# Patient Record
Sex: Female | Born: 1975 | Race: Black or African American | Hispanic: No | Marital: Single | Smoking: Current some day smoker
Health system: Southern US, Community
[De-identification: ages and names within clinical notes are randomized; demographics above are authoritative.]

## PROBLEM LIST (undated history)

## (undated) DIAGNOSIS — D219 Benign neoplasm of connective and other soft tissue, unspecified: Secondary | ICD-10-CM

## (undated) DIAGNOSIS — D649 Anemia, unspecified: Secondary | ICD-10-CM

## (undated) HISTORY — DX: Benign neoplasm of connective and other soft tissue, unspecified: D21.9

## (undated) HISTORY — DX: Anemia, unspecified: D64.9

---

## 2000-04-03 ENCOUNTER — Emergency Department (HOSPITAL_COMMUNITY): Admission: EM | Admit: 2000-04-03 | Discharge: 2000-04-03 | Payer: Self-pay | Admitting: Emergency Medicine

## 2000-04-03 ENCOUNTER — Encounter: Payer: Self-pay | Admitting: Emergency Medicine

## 2000-04-15 ENCOUNTER — Ambulatory Visit (HOSPITAL_COMMUNITY): Admission: RE | Admit: 2000-04-15 | Discharge: 2000-04-15 | Payer: Self-pay | Admitting: General Surgery

## 2000-04-16 ENCOUNTER — Encounter: Payer: Self-pay | Admitting: General Surgery

## 2000-04-20 ENCOUNTER — Encounter: Payer: Self-pay | Admitting: General Surgery

## 2000-04-20 ENCOUNTER — Encounter (INDEPENDENT_AMBULATORY_CARE_PROVIDER_SITE_OTHER): Payer: Self-pay | Admitting: *Deleted

## 2000-04-20 ENCOUNTER — Ambulatory Visit (HOSPITAL_COMMUNITY): Admission: RE | Admit: 2000-04-20 | Discharge: 2000-04-20 | Payer: Self-pay | Admitting: General Surgery

## 2001-01-24 ENCOUNTER — Emergency Department (HOSPITAL_COMMUNITY): Admission: EM | Admit: 2001-01-24 | Discharge: 2001-01-25 | Payer: Self-pay | Admitting: Emergency Medicine

## 2002-02-06 ENCOUNTER — Encounter: Payer: Self-pay | Admitting: Emergency Medicine

## 2002-02-06 ENCOUNTER — Emergency Department (HOSPITAL_COMMUNITY): Admission: EM | Admit: 2002-02-06 | Discharge: 2002-02-06 | Payer: Self-pay | Admitting: Emergency Medicine

## 2002-05-07 ENCOUNTER — Emergency Department (HOSPITAL_COMMUNITY): Admission: EM | Admit: 2002-05-07 | Discharge: 2002-05-07 | Payer: Self-pay | Admitting: Emergency Medicine

## 2004-04-03 ENCOUNTER — Emergency Department (HOSPITAL_COMMUNITY): Admission: EM | Admit: 2004-04-03 | Discharge: 2004-04-03 | Payer: Self-pay | Admitting: Emergency Medicine

## 2004-04-07 ENCOUNTER — Emergency Department (HOSPITAL_COMMUNITY): Admission: EM | Admit: 2004-04-07 | Discharge: 2004-04-07 | Payer: Self-pay | Admitting: Emergency Medicine

## 2006-12-08 ENCOUNTER — Inpatient Hospital Stay (HOSPITAL_COMMUNITY): Admission: AD | Admit: 2006-12-08 | Discharge: 2006-12-08 | Payer: Self-pay | Admitting: Family Medicine

## 2010-05-28 ENCOUNTER — Inpatient Hospital Stay (HOSPITAL_COMMUNITY): Payer: Self-pay

## 2010-05-28 ENCOUNTER — Inpatient Hospital Stay (HOSPITAL_COMMUNITY)
Admission: AD | Admit: 2010-05-28 | Discharge: 2010-05-28 | Disposition: A | Discharge status: Home / Self Care | Payer: Self-pay | Source: Ambulatory Visit | Attending: Obstetrics & Gynecology | Admitting: Obstetrics & Gynecology

## 2010-05-28 DIAGNOSIS — N200 Calculus of kidney: Secondary | ICD-10-CM

## 2010-05-28 DIAGNOSIS — R109 Unspecified abdominal pain: Secondary | ICD-10-CM

## 2010-05-28 DIAGNOSIS — A5901 Trichomonal vulvovaginitis: Secondary | ICD-10-CM

## 2010-05-28 DIAGNOSIS — D259 Leiomyoma of uterus, unspecified: Secondary | ICD-10-CM

## 2010-05-28 LAB — URINALYSIS, ROUTINE W REFLEX MICROSCOPIC
Bilirubin Urine: NEGATIVE
Ketones, ur: NEGATIVE mg/dL
Leukocytes, UA: NEGATIVE
Nitrite: NEGATIVE
Protein, ur: NEGATIVE mg/dL
Specific Gravity, Urine: 1.015 (ref 1.005–1.030)
Urine Glucose, Fasting: NEGATIVE mg/dL
Urobilinogen, UA: 1 mg/dL (ref 0.0–1.0)
pH: 7 (ref 5.0–8.0)

## 2010-05-28 LAB — DIFFERENTIAL
Basophils Relative: 0 % (ref 0–1)
Eosinophils Absolute: 0.1 10*3/uL (ref 0.0–0.7)
Eosinophils Relative: 2 % (ref 0–5)
Monocytes Relative: 7 % (ref 3–12)
Neutrophils Relative %: 58 % (ref 43–77)

## 2010-05-28 LAB — CBC
MCH: 29.7 pg (ref 26.0–34.0)
MCV: 88.8 fL (ref 78.0–100.0)
Platelets: 269 10*3/uL (ref 150–400)
RBC: 4.38 MIL/uL (ref 3.87–5.11)
RDW: 13.5 % (ref 11.5–15.5)

## 2010-05-28 LAB — WET PREP, GENITAL: Yeast Wet Prep HPF POC: NONE SEEN

## 2010-05-28 LAB — URINE MICROSCOPIC-ADD ON

## 2010-05-28 LAB — POCT PREGNANCY, URINE: Preg Test, Ur: NEGATIVE

## 2010-05-28 MED ORDER — IOHEXOL 300 MG/ML  SOLN
100.0000 mL | Freq: Once | INTRAMUSCULAR | Status: AC | PRN
  Administered 2010-05-28: 100 mL via INTRAVENOUS

## 2010-05-29 LAB — GC/CHLAMYDIA PROBE AMP, GENITAL: GC Probe Amp, Genital: NEGATIVE

## 2010-06-06 ENCOUNTER — Encounter (INDEPENDENT_AMBULATORY_CARE_PROVIDER_SITE_OTHER): Payer: Self-pay | Admitting: Obstetrics and Gynecology

## 2010-06-06 ENCOUNTER — Encounter (INDEPENDENT_AMBULATORY_CARE_PROVIDER_SITE_OTHER): Payer: Self-pay | Admitting: *Deleted

## 2010-06-06 DIAGNOSIS — N946 Dysmenorrhea, unspecified: Secondary | ICD-10-CM

## 2010-06-06 DIAGNOSIS — D259 Leiomyoma of uterus, unspecified: Secondary | ICD-10-CM

## 2010-06-06 LAB — CONVERTED CEMR LAB: Trich, Wet Prep: NONE SEEN

## 2010-06-28 NOTE — Progress Notes (Signed)
NAMEPRESLEY, GORA NO.:  [id number]  MEDICAL RECORD NO.:  [id number]           PATIENT TYPE:  A  LOCATION:  [facility]                   FACILITY:  [facility]  PHYSICIAN:  Catalina Antigua, MD     DATE OF BIRTH:  1975-12-18  DATE OF SERVICE:  06/06/2010                                 CLINIC NOTE  This is a 35 year old nulligravida patient who presents today as an MAU followup for, the patient states as, test-of-cure for Trichomonas.  The patient was seen in MAU on May 28, 2010, for evaluation of abdominal pain.  The patient had a CAT scan on that day which demonstrated the presence of left nephrolithiasis without hydronephrosis and incidental finding of a 7.2-cm degenerating fibroid.  The patient states that prior to her MAU visit, she has never had any pelvic pain other than associated with her periods.  The patient states that following her MAU visit and the passage of the kidney stones, again she has not had any chronic pelvic pain but rather irregular dysmenorrhea. The patient describes her period as occurring every month, lasting 5 days, but states they are very heavy with occasional passage of clots. The patient denies any chest pain or lightheadedness or any hospitalization for blood transfusion.  As stated before, the patient presents today for test-of-cure for Trichomonas and was inquiring on further management of her fibroids.  PAST MEDICAL HISTORY:  She denies.  PAST SURGICAL HISTORY:  She denies.  PAST OB HISTORY:  She is nulligravid.  PAST GYN HISTORY:  She does have fibroids.  She denies any history of ovarian cyst.  She does have a history of Trichomonas which was recently treated and states that her partner has also been treated.  The onset of her menses occurred at the age of 28 and they have always been regular. She is not currently using any form of birth control at this time.  REVIEW OF SYSTEMS:  Otherwise within normal  limits.  FAMILY HISTORY:  Significant for sister with type of cancer which she is unclear of the etiology but states that it is not ovarian or breast cancer.  SOCIAL HISTORY:  The patient is a current smoker but states that she is trying to quit at this time.  She denies the abuse of alcohol or illicit drug use.  The patient is in a relationship and would like to conceive in the future.  PHYSICAL EXAMINATION:  ABDOMEN:  Soft, nontender, nondistended. PELVIC:  She had normal vaginal mucosa and normal-appearing cervix.  No abnormal bleeding or discharge.  She had approximately 10-week size uterus.  No palpable adnexal masses or tenderness.  ASSESSMENT AND PLAN:  This is a 35 year old nulligravid patient with history of dysmenorrhea and fibroid uterus who presents today following treatment of Trichomonas.  The patient desiring test of cure.  Repeat wet prep was performed today.  The patient was counseled regarding management of her dysmenorrhea with birth control and is interested in Depo-Provera at this time.  The patient will return in 3 months for continued Depo-Provera or further management.  The patient was counseled that if medical management of her dysmenorrhea is not  sufficient that perhaps a myomectomy may be necessary.  The patient verbalized understanding and all questions were answered.  The patient will be contacted with any abnormal results and will return in 3 months.          ______________________________ Catalina Antigua, MD    PC/MEDQ  D:  06/06/2010  T:  06/07/2010  Job:  [id number]

## 2010-07-18 ENCOUNTER — Inpatient Hospital Stay (HOSPITAL_COMMUNITY): Payer: Self-pay

## 2010-07-18 ENCOUNTER — Inpatient Hospital Stay (HOSPITAL_COMMUNITY)
Admission: AD | Admit: 2010-07-18 | Discharge: 2010-07-18 | Disposition: A | Discharge status: Home / Self Care | Payer: Self-pay | Source: Ambulatory Visit | Attending: Obstetrics & Gynecology | Admitting: Obstetrics & Gynecology

## 2010-07-18 DIAGNOSIS — R109 Unspecified abdominal pain: Secondary | ICD-10-CM | POA: Insufficient documentation

## 2010-07-18 DIAGNOSIS — D259 Leiomyoma of uterus, unspecified: Secondary | ICD-10-CM

## 2010-07-18 LAB — WET PREP, GENITAL
Trich, Wet Prep: NONE SEEN
Yeast Wet Prep HPF POC: NONE SEEN

## 2010-07-18 LAB — URINALYSIS, ROUTINE W REFLEX MICROSCOPIC
Bilirubin Urine: NEGATIVE
Glucose, UA: NEGATIVE mg/dL
Specific Gravity, Urine: 1.025 (ref 1.005–1.030)
Urobilinogen, UA: 0.2 mg/dL (ref 0.0–1.0)
pH: 7 (ref 5.0–8.0)

## 2010-07-18 LAB — COMPREHENSIVE METABOLIC PANEL
ALT: 18 U/L (ref 0–35)
Albumin: 3.7 g/dL (ref 3.5–5.2)
Alkaline Phosphatase: 56 U/L (ref 39–117)
Calcium: 8.9 mg/dL (ref 8.4–10.5)
GFR calc Af Amer: >60 mL/min (ref >60)
Glucose, Bld: 87 mg/dL (ref 70–99)
Potassium: 3.7 mEq/L (ref 3.5–5.1)
Sodium: 136 mEq/L (ref 135–145)
Total Protein: 6.5 g/dL (ref 6.0–8.3)

## 2010-07-18 LAB — URINE MICROSCOPIC-ADD ON

## 2010-07-18 LAB — POCT PREGNANCY, URINE: Preg Test, Ur: NEGATIVE

## 2010-07-18 LAB — CBC
HCT: 39.6 % (ref 36.0–46.0)
Hemoglobin: 12.9 g/dL (ref 12.0–15.0)
MCV: 90.4 fL (ref 78.0–100.0)
RBC: 4.38 MIL/uL (ref 3.87–5.11)
RDW: 14 % (ref 11.5–15.5)
WBC: 8.7 10*3/uL (ref 4.0–10.5)

## 2010-07-19 ENCOUNTER — Ambulatory Visit: Payer: Self-pay | Admitting: Family

## 2010-07-19 LAB — GC/CHLAMYDIA PROBE AMP, GENITAL
Chlamydia, DNA Probe: NEGATIVE
GC Probe Amp, Genital: NEGATIVE

## 2010-07-19 LAB — URINE CULTURE

## 2010-08-01 ENCOUNTER — Ambulatory Visit (INDEPENDENT_AMBULATORY_CARE_PROVIDER_SITE_OTHER): Payer: Self-pay | Admitting: Obstetrics & Gynecology

## 2010-08-01 DIAGNOSIS — N949 Unspecified condition associated with female genital organs and menstrual cycle: Secondary | ICD-10-CM

## 2010-08-01 DIAGNOSIS — D259 Leiomyoma of uterus, unspecified: Secondary | ICD-10-CM

## 2010-08-01 DIAGNOSIS — Z01818 Encounter for other preprocedural examination: Secondary | ICD-10-CM

## 2010-08-02 NOTE — Group Therapy Note (Signed)
Paige Rowe, Paige Rowe NO.:  [id number]  MEDICAL RECORD NO.:  [id number]           PATIENT TYPE:  A  LOCATION:  [facility]                   [facility]ACILITY:  [facility]  PHYSICIAN:  Jaynie Collins, MD     DATE OF BIRTH:  01-23-1976  DATE OF SERVICE:  08/01/2010                                 CLINIC NOTE  REASON FOR VISIT:  Surgical consultation.  The patient is a 34 year old nulligravida patient, last menstrual period of June 28, 2010, who is here for surgical consult for symptomatic large uterine fibroid.  The patient did have an ultrasound that was done on July 18, 2010, which showed a 10-week size uterus, but with a 7.6 cm right posterolateral upper uterine segment fibroid.  The rest of the myometrium was homogeneous and endometrium in thin.  Right ovary was not visualized, left ovary was normal.  The patient is currently being managed on hydromorphone and ibuprofen.  She does not need a prescription for these today.  She also did get a Depo-Provera injection to help with her dysfunctional uterine bleeding and to hold her until a myomectomy is scheduled.  The patient does want to proceed with scheduling this myomectomy.  She does not have any pregnancies and does want to retain childbearing ability.  The patient was counseled regarding the risks of abdominal myomectomy, the routine risks of bleeding, infection, injury to surrounding organs, need for additional procedures were reviewed with the patient.  She has only had one other prior procedure, which was a benign mass removal from her right hip area.  It sounds like it could have been the lipoma.  She did not have any anesthetic or surgical complications.  No other comorbidities.  The patient was told about the routine hospital time and recovery time, and all her questions were answered.  She was told to expect to hear from our surgical scheduler regarding the time and date of her surgery.  In the meantime, she  should be on pain medications.  If she does run out of pain medications, she should call for a refill prescription.  The patient was told to call or come back in for her any further gynecologic concerns.          ______________________________ Jaynie Collins, MD    UA/MEDQ  D:  08/01/2010  T:  08/02/2010  Job:  [id number]

## 2010-08-06 HISTORY — PX: MYOMECTOMY: SHX85

## 2010-08-07 ENCOUNTER — Ambulatory Visit: Payer: Self-pay | Admitting: Obstetrics & Gynecology

## 2010-08-12 ENCOUNTER — Other Ambulatory Visit: Payer: Self-pay | Admitting: Obstetrics & Gynecology

## 2010-08-12 ENCOUNTER — Encounter (HOSPITAL_COMMUNITY): Payer: Self-pay | Attending: Obstetrics & Gynecology

## 2010-08-12 DIAGNOSIS — Z01818 Encounter for other preprocedural examination: Secondary | ICD-10-CM | POA: Insufficient documentation

## 2010-08-12 DIAGNOSIS — Z01812 Encounter for preprocedural laboratory examination: Secondary | ICD-10-CM | POA: Insufficient documentation

## 2010-08-12 LAB — CBC
HCT: 38.4 % (ref 36.0–46.0)
Hemoglobin: 12.9 g/dL (ref 12.0–15.0)
MCV: 89.3 fL (ref 78.0–100.0)
WBC: 7.5 10*3/uL (ref 4.0–10.5)

## 2010-08-12 LAB — SURGICAL PCR SCREEN: MRSA, PCR: NEGATIVE

## 2010-08-19 ENCOUNTER — Inpatient Hospital Stay (HOSPITAL_COMMUNITY)
Admission: AD | Admit: 2010-08-19 | Discharge: 2010-08-20 | DRG: 743 | Disposition: A | Discharge status: Home / Self Care | Payer: Self-pay | Source: Ambulatory Visit | Attending: Obstetrics & Gynecology | Admitting: Obstetrics & Gynecology

## 2010-08-19 ENCOUNTER — Other Ambulatory Visit: Payer: Self-pay | Admitting: Obstetrics & Gynecology

## 2010-08-19 DIAGNOSIS — N949 Unspecified condition associated with female genital organs and menstrual cycle: Secondary | ICD-10-CM

## 2010-08-19 DIAGNOSIS — D252 Subserosal leiomyoma of uterus: Principal | ICD-10-CM | POA: Diagnosis present

## 2010-08-19 LAB — TYPE AND SCREEN: Antibody Screen: NEGATIVE

## 2010-08-19 LAB — PREGNANCY, URINE: Preg Test, Ur: NEGATIVE

## 2010-08-20 LAB — CBC
MCHC: 32.7 g/dL (ref 30.0–36.0)
MCV: 89.4 fL (ref 78.0–100.0)
Platelets: 243 10*3/uL (ref 150–400)
RDW: 13.4 % (ref 11.5–15.5)
WBC: 11.6 10*3/uL — ABNORMAL HIGH (ref 4.0–10.5)

## 2010-08-23 NOTE — Op Note (Signed)
[facility]  Patient:    Paige Rowe, Paige Rowe                       MRN: [id number] Proc. Date: 04/20/00 Adm. Date:  [id number] Attending:  Henrene Dodge CC:         Anselm Pancoast. Zachery Dakins, M.D.   Operative Report  PREOPERATIVE DIAGNOSIS: Large mass of right hip, subcutaneous.  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  SURGEON: Anselm Pancoast. Zachery Dakins, M.D.  ASSISTANT: Currie Paris, M.D.  OPERATION/PROCEDURE: Excision of large complex subcutaneous mass of right hip area.  ANESTHESIA: General.  INDICATIONS FOR PROCEDURE: The patient is a 35 year old black female, originally from [place], [place], presented to the [facility] Emergency Room with pain and enlarging mass in the right hip area of several weeks duration.  The patient stated that she had had a mass that started small and was thought to be a fatty tumor.  She had seen a physician in [place], [place].  This has just kind of gradually enlarged.  It was not symptomatic until recently.  She started having low-grade and then significant pain and she presented to the ER.  She was evaluated there and a pelvic x-ray obtained was essentially unremarkable.  She was referred to our office and I saw her Monday one week ago, and she had this mass that was about the size of a baseball and about the consistency of a baseball and quite tender.  I was concerned that it was probably a malignancy. CT without contrast was performed and this showed changes of a dense mass that did not look like a lipoma.  It was not contiguous with the muscles of the hip structures themselves, with a clear margin.  We recommended that it be excised.  I had Dr. Gerrit Friends see the patient and he was in agreement that basically complete excision with subcutaneous margin would be his suggestion, with the possibility of this being some  type of sarcoma type lesion.  The x-rays showed that the greatest dimension was about 6 cm and of course the mass felt was larger than that.  DESCRIPTION OF PROCEDURE: The patient was taken to the operating room and given general anesthesia and then placed on a bean bag sort of turned with the right hip up.  A pillow was placed under the legs and straps.  I prepped the area widely with Betadine scrub and solution and then draped her a in sterile manner.  We marked the skin edges and made the incision in oblique overt fashion.  With Dr. Jamey Ripa assisting we entered the subcutaneous plane, never really down on the center, removing only the pseudocapsule type thing.  The areas under it where the blood vessels came in were sutured and controlled with good hemostasis, and after superior and inferior flaps were developed then underlying right on the fascia.  There were a couple of areas where the blood vessels were coming in that if there was any questionable margins would be these and they were tagged with two Prolenes and two Vicryls that were tied together and then when the mass was excised, which was greater than a 7 cm mass not included in the fatty tissue around it was oriented for the pathologist.  We then closed the wound with subcuticular 4-0 Vicryl and skin staples were placed.  I did place a 10 mm Blake drain brought out anteriorly in the incision area.  Vaseline gauze was placed over it and then a sterile occlusive dressing with Bactrim was applied.  I think she will be able to be released after a short stay.  Her mother is a Therapist, art and has offered to stay with the patient for the next few days.  Dr. Clelia Croft received the specimen fresh and thought the margins were probably okay.  On cutting the tumor whatever it is is infarcted.  Whether it was a lipoma or some other liposarcoma she could tell on frozen section.  We will have a final report hopefully in a couple of days. DD:   04/20/00 TD:  04/20/00 Job: [id number]

## 2010-08-26 NOTE — Op Note (Signed)
Paige Rowe, Paige Rowe              ACCOUNT NO.:  [id number]  MEDICAL RECORD NO.:  [id number]           PATIENT TYPE:  O  LOCATION:  9319                          [facility]  PHYSICIAN:  Horton Chin, MD DATE OF BIRTH:  1976/03/12  DATE OF PROCEDURE:  08/19/2010 DATE OF DISCHARGE:                              OPERATIVE REPORT   PREOPERATIVE DIAGNOSIS:  Symptomatic uterine leiomyomata.  POSTOPERATIVE DIAGNOSIS:  Symptomatic uterine leiomyomata.  PROCEDURE:  Abdominal myomectomy.  SURGEON:  Horton Chin, MD  ASSISTANT:  Catalina Antigua, MD  INDICATIONS:  The patient is a 35 year old nulligravida patient who desired surgery for symptomatic large uterine leiomyomata. An ultrasound that was done in April 2012 showed a 10 weeks' size uterus with about an 8-cm right posterolateral upper uterine segment fibroid.  The patient wanted abdominal myomectomy as she desired future pregnancy.  The risks of abdominal myomectomy were explained to the patient including but not limited to bleeding, infection, injury to surrounding organs, need for additional procedures, possible hysterectomy in the event of severe bleeding, thromboembolic phenomenon and anesthesia complications. Patient was also informed of possible need for cesarean section with any subsequent pregnancy depending on the extent of myometrial or  endometrial involvement.  All her questions were answered and written  informed consent was obtained.    FINDINGS: A large posterior fibroid that was central in position about 8 cm in diameter, and 2 small posterior subserosal fibroids about 2 cm in diameter each.  There was no entry into the endometrium, but it was very close as about 80% of the myometrium was traversed during the removal of the large fibroid.  Normal ovaries and fallopian tubes bilaterally. Interceed adhesion barrier was placed on the hysterotomy incision at the end of the myomectomy.  ANESTHESIA:   General.  IV FLUIDS:  2 liters of lactated Ringer's.  ESTIMATED BLOOD LOSS:  100 mL.  URINE OUTPUT:  200 mL.  SPECIMENS:  Leiomyomata (three); one about 8 cm, two about 2 cm each.  DISPOSITION OF SPECIMEN:  Pathology.  COMPLICATIONS:  None immediate.  PROCEDURE DETAILS:  The patient received preoperative antibiotics and has sequential compression devices applied to her lower extremities while in the preoperative area.  She was then taken to the operating room where general analgesia was administered and found to be adequate. The patient was then placed in a dorsal supine position and prepped and draped in a sterile manner.  A foley catheter was inserted into the patient's bladder and attached to constant gravity.  After an adequate time-out was performed, attention was turned to the patient's abdomen where a Pfannenstiel incision was made with a scalpel and carried down to the underlying layer of fascia.  The fascia was incised bilaterally, Kochers were placed on both aspects of the fascial incision and the underlying rectus muscles were dissected off bluntly and sharply.  The rectus muscles were then separated in the midline and the peritoneum was entered bluntly.  This incision was extended superiorly and inferiorly with good visualization of bowel and bladder.  At this point, the uterus was recognized and was able to be delivered up  out of the pelvis.  The uterus was then surrounded with laparotomy sponges.  The large myoma was identified and two smaller subserosal myomas were identified.  A solution of 20 units of vasopressin diluted in 100 mL of normal saline was injected in the serosa overlying the large myoma.  An incision was  made using the Bovie through the serosa to the capsule of the large  myoma.  The large myoma was grasped with towel hooks and using a  combination of blunt and sharp dissections, the myoma was able to be freed from the surrounding myometrium.  The  two small myomas were able to be also excised through this large incision.  The defect that was created from the large myoma in the myometrium was impressive.  There was no entry into the endometrium, but over 80% of the myometrium was affected, and the incision was on the superior part of the uterus  which is the active fundal portion of the uterus.  The myomectomy defect was then reapproximated in layers using a 0 Vicryl running interlocking stitch in layers; four layers were needed. A serosal stitch was done using 2-0 Vicryl in a baseball stitch.  Overall, good hemostasis was noted.  A sheet of Interceed was placed on the uterine incision and the uterus was then returned to the abdomen.  The peritoneum was then closed using 2-0 Vicryl in a running fashion.  The fascia was reapproximated using #1 Vicryl in a running fashion.  The subcutaneous layer was irrigated and then also reapproximated using interrupted stitches of 2-0 Vicryl and the skin was closed with a 4-0 Vicryl subcuticular stitch.  The patient tolerated the procedure well. Sponge, instrument, and needle counts were correct x2.  She was taken to the recovery room in awake, extubated, and in stable condition.     Horton Chin, MD     UAA/MEDQ  D:  08/19/2010  T:  08/20/2010  Job:  [id number]  Electronically Signed by Jaynie Collins MD on 08/26/2010 10:04:58 AM

## 2010-08-26 NOTE — Discharge Summary (Signed)
  Paige Rowe, Paige Rowe              ACCOUNT NO.:  [id number]  MEDICAL RECORD NO.:  [id number]           PATIENT TYPE:  I  LOCATION:  9319                          [facility]  PHYSICIAN:  Horton Chin, MD DATE OF BIRTH:  10-17-1975  DATE OF ADMISSION:  08/19/2010 DATE OF DISCHARGE:  08/20/2010                              DISCHARGE SUMMARY   ADMISSION DIAGNOSIS:  Symptomatic uterine leiomyomata.  PROCEDURES:  Abdominal myomectomy.  PERTINENT STUDIES:  Preoperative hemoglobin of 12.9, postoperative hemoglobin of 12.4. Pathology confirmed diagnosis of leiomyomata.  BRIEF HOSPITAL COURSE:  The patient is a 35 year old nulligravida who presented with symptomatic uterine fibroids including 8-cm large posterior fibroid.  The patient desired myomectomy and underwent an uncomplicated abdominal myomectomy.  For further details of this operation please refer to separate dictated operative report.  The patient's procedure was uncomplicated and her postoperative course was uncomplicated.  By postoperative day #1, her pain was controlled on oral pain medications.  She was ambulating and voiding without difficulty, tolerating regular diet and passing flatus.  The patient was deemed stable for discharge to home.  DISCHARGE MEDICATIONS:  Percocet 5/325 mg 1-2 tablets by mouth every six  hours as needed for severe pain; Ibuprofen 600 mg tablet by mouth every  six hours as needed for mild-moderate pain;  Colace 100 mg capsule by  mouth twice a day as needed for constipation.  DISCHARGE INSTRUCTIONS: Routine postoperative discharge instructions including pelvic rest for 4 weeks, no heavy lifting for 6 weeks. The patient was told to come to the MAU for any fevers, pain not controlled on pain medications, abnormal bleeding or any other concerns.  She was told to call the gynecologic clinic in 3 weeks for her postoperative evaluation and appointment.      Horton Chin,  MD     UAA/MEDQ  D:  08/20/2010  T:  08/21/2010  Job:  [id number]  Electronically Signed by Jaynie Collins MD on 08/26/2010 10:09:56 AM

## 2010-09-19 ENCOUNTER — Ambulatory Visit: Payer: Self-pay | Admitting: Obstetrics & Gynecology

## 2010-09-25 ENCOUNTER — Ambulatory Visit: Payer: Self-pay | Admitting: Obstetrics & Gynecology

## 2010-09-25 DIAGNOSIS — Z09 Encounter for follow-up examination after completed treatment for conditions other than malignant neoplasm: Secondary | ICD-10-CM

## 2010-11-18 ENCOUNTER — Telehealth: Payer: Self-pay | Admitting: *Deleted

## 2010-11-18 NOTE — Telephone Encounter (Signed)
Pt did not state reason for her call- only gave DOB and tel #.

## 2010-11-19 NOTE — Telephone Encounter (Signed)
Called pt and left message that we are returning her call and to return our call to the clinics.

## 2010-11-21 NOTE — Telephone Encounter (Signed)
Called pt after discussing with Dr. Macon Large, pt. Also received depoprovera  In June , second dose, discussed with pt normal to have irregular bleeding after depoprovera started, pt has not had a period since myomectomy.  Pt denies heavy bleeding and denies strong pain.  Pt states she has made an appt for sept to see provider and plans to keep appt. Advised to come to hospital if has heavy bleeding or strong pain.

## 2010-11-21 NOTE — Telephone Encounter (Signed)
Called pt, states had myomectomy 08/19/10 and bleeding resolved after that, now c/o mild cramping./mild throbbing/discomfort with spotting noted sometimes on underwear or when wipes after urinating x 2.5 weeks.  Informed pt would review chart and call her back

## 2010-12-13 ENCOUNTER — Encounter: Payer: Self-pay | Admitting: Obstetrics & Gynecology

## 2010-12-13 ENCOUNTER — Ambulatory Visit (INDEPENDENT_AMBULATORY_CARE_PROVIDER_SITE_OTHER): Payer: Self-pay | Admitting: Obstetrics & Gynecology

## 2010-12-13 VITALS — BP 121/89 | HR 66 | Temp 97.5°F | Ht 64.0 in | Wt 154.1 lb

## 2010-12-13 DIAGNOSIS — N938 Other specified abnormal uterine and vaginal bleeding: Secondary | ICD-10-CM

## 2010-12-13 DIAGNOSIS — N949 Unspecified condition associated with female genital organs and menstrual cycle: Secondary | ICD-10-CM

## 2010-12-13 LAB — POCT PREGNANCY, URINE: Preg Test, Ur: NEGATIVE

## 2010-12-13 MED ORDER — NORGESTIMATE-ETH ESTRADIOL 0.25-35 MG-MCG PO TABS: 1.0000 | ORAL_TABLET | Freq: Every day | ORAL | Status: DC

## 2010-12-13 NOTE — Progress Notes (Signed)
  Subjective:    Patient ID: Paige Rowe, female    DOB: 20-Aug-1975, 35 y.o.   MRN: [id number]  HPI [id number] with persistent vaginal bleeding since prior to DMPA injection on 09/25/10. Abdominal myomectomy 08/2010  Past Surgical History  Procedure Date  . Myomectomy may 2012   History reviewed. No pertinent past medical history. Allergies  Allergen Reactions  . Latex Itching   Scheduled Meds:    PRN Meds:.  History reviewed. No pertinent family history.      Review of Systems Dark vaginal bleeding, no pain, no fever    Objective:   Physical Exam  Constitutional: She appears well-developed and well-nourished. No distress.  Abdominal: Soft. She exhibits no mass. There is no tenderness.       Incision well healed  Genitourinary: Vagina normal and uterus normal. No vaginal discharge found.       Slight bleeding, pap obtained. No mass          Assessment & Plan:  DUB, on DMPA, wants to discontinue. RX Sprintec for one month, schedule pelvic ultrasound, return in 6 weeks

## 2010-12-16 ENCOUNTER — Telehealth: Payer: Self-pay | Admitting: *Deleted

## 2010-12-16 NOTE — Telephone Encounter (Signed)
Pt states she thought she was going to get a Rx @ her last visit. She would like a call back.

## 2010-12-16 NOTE — Telephone Encounter (Signed)
I called pt- she was not @ home and would not return until 6 pm. I spoke w/her boyfriend. I asked him to tell Paige Rowe that her prescription is @ her pharmacy.

## 2010-12-17 ENCOUNTER — Other Ambulatory Visit: Payer: Self-pay | Admitting: Obstetrics & Gynecology

## 2010-12-17 ENCOUNTER — Ambulatory Visit (HOSPITAL_COMMUNITY)
Admission: RE | Admit: 2010-12-17 | Discharge: 2010-12-17 | Disposition: A | Discharge status: Home / Self Care | Payer: Self-pay | Source: Ambulatory Visit | Attending: Obstetrics & Gynecology | Admitting: Obstetrics & Gynecology

## 2010-12-17 DIAGNOSIS — D259 Leiomyoma of uterus, unspecified: Secondary | ICD-10-CM | POA: Insufficient documentation

## 2010-12-17 DIAGNOSIS — N938 Other specified abnormal uterine and vaginal bleeding: Secondary | ICD-10-CM | POA: Insufficient documentation

## 2010-12-17 DIAGNOSIS — N949 Unspecified condition associated with female genital organs and menstrual cycle: Secondary | ICD-10-CM | POA: Insufficient documentation

## 2010-12-31 ENCOUNTER — Telehealth: Payer: Self-pay | Admitting: *Deleted

## 2010-12-31 NOTE — Telephone Encounter (Signed)
Pt left message stating that she missed a birth control pill and wants advice. I called pt this morning. She states she has missed 2 pills because she was out of town. Her pack of pills is being sent to her and she wants to know what to do. I told pt to obtain another pack of pills from her pharmacy as she has refills available. She needs to take 2 pills today and then 2 pills tomorrow. She may resume 1 pill daily after that. When her original pack is received, she should return to that pack and take the corresponding Theresea Trautmann's pill that she needs. (this will leave some pills not taken). Once she gets to the end of the pack, she may go back to the untaken pills as if she was starting a new pack and then continue with the remaining pills of the second pack being prescribed today. This scenario will create 2 complete menstrual cycles. Pt is strongly advised that she must use condoms w/each episode of intercourse for the remainder of her pack of pills in order to guard against unplanned pregnancy. If for some reason she does not receive her pills in the mail, she may continue with the second pack until completed. Pt voiced understanding.

## 2011-01-14 ENCOUNTER — Telehealth: Payer: Self-pay | Admitting: *Deleted

## 2011-01-14 NOTE — Telephone Encounter (Signed)
Pt left message which was unclear but had to do with her medication refill. I returned her call and left message stating that if she needed a refill of birth control med, just to call her pharmacy- refills are available. If this is not her question, she should call back with additional details.

## 2011-01-17 LAB — WET PREP, GENITAL: Yeast Wet Prep HPF POC: NONE SEEN

## 2011-01-17 LAB — GC/CHLAMYDIA PROBE AMP, GENITAL
Chlamydia, DNA Probe: NEGATIVE
GC Probe Amp, Genital: NEGATIVE

## 2011-01-17 LAB — URINALYSIS, ROUTINE W REFLEX MICROSCOPIC
Bilirubin Urine: NEGATIVE
Ketones, ur: NEGATIVE
Leukocytes, UA: NEGATIVE
Nitrite: NEGATIVE
Specific Gravity, Urine: 1.015
Urobilinogen, UA: 0.2
pH: 6.5

## 2011-01-17 LAB — POCT PREGNANCY, URINE
Operator id: 22333
Preg Test, Ur: NEGATIVE

## 2011-01-17 LAB — CBC
Platelets: 262
RBC: 3.97
WBC: 8.4

## 2011-01-17 LAB — ABO/RH: ABO/RH(D): O POS

## 2011-02-14 ENCOUNTER — Telehealth: Payer: Self-pay | Admitting: *Deleted

## 2011-02-14 NOTE — Telephone Encounter (Signed)
Pt left message requesting nurse to call back. She can be reached @ [phone]

## 2011-02-14 NOTE — Telephone Encounter (Signed)
Called pt and discussed her concerns. She states that her last period was very heavy and had lots of clots. She wanted to know if this was related to her surgery in May for removal of fibroids. I told her that I wasn't sure, but probably not. She stated that she recently switched from depo-provera to OCP's.  I explained that whenever a new hormone regimen is started, it takes several months for the body to adjust. I advised her of the amount of bleeding which should prompt a return to the hospital. I also told her to return if she has severe weakness or dizziness. She stated she has been having severe cramping along with her periods. She had purchased some Aleve but it did not work well.  I advised Aleve- 2 tabs by mouth every 12 hrs as needed- which is a higher dose than she had used. Pt also asked if there is a pharmacy where her Rx for sprintec would be less expensive. I advised transferring her Rx to a [facility] or [facility]. Pt also needs follow up appt which was not scheduled @ time of last visit. I will send message to the scheduling staff and she will be contacted next week with appt information.  Pt voiced understanding of all information.

## 2011-03-17 ENCOUNTER — Telehealth: Payer: Self-pay | Admitting: *Deleted

## 2011-03-17 NOTE — Telephone Encounter (Signed)
Patient called again 03/16/11 and left a message she had called yesterday and "I don't know if someone called me, but you can call me today"

## 2011-03-17 NOTE — Telephone Encounter (Signed)
Patient called twice 03/15/11 and left messages stating she needs one of the nurses to call her back about having stomach pains and on BCP's.

## 2011-03-17 NOTE — Telephone Encounter (Signed)
Called patient and left message we are returning your calls- also notified patient we are closed over the weekend and that is why we have not returned your calls until now.  Please call clinic tomorrow during office hours if you still need assistance

## 2011-03-19 NOTE — Telephone Encounter (Signed)
Called patient and left a message we are returning your call from the weekend- we have called several times- if you still need assistance you will need to call again and leave a new message.

## 2011-04-04 ENCOUNTER — Ambulatory Visit: Payer: Self-pay | Admitting: Obstetrics & Gynecology

## 2011-08-25 ENCOUNTER — Telehealth: Payer: Self-pay | Admitting: *Deleted

## 2011-08-25 NOTE — Telephone Encounter (Signed)
Patient left a message stating that she needs to speak with a nurse.

## 2011-08-27 ENCOUNTER — Encounter: Payer: Self-pay | Admitting: Obstetrics & Gynecology

## 2011-08-27 ENCOUNTER — Telehealth: Payer: Self-pay | Admitting: Obstetrics & Gynecology

## 2011-08-27 ENCOUNTER — Ambulatory Visit (INDEPENDENT_AMBULATORY_CARE_PROVIDER_SITE_OTHER): Payer: Self-pay | Admitting: Obstetrics & Gynecology

## 2011-08-27 VITALS — BP 141/92 | HR 58 | Temp 97.3°F | Ht 64.0 in | Wt 164.1 lb

## 2011-08-27 DIAGNOSIS — A499 Bacterial infection, unspecified: Secondary | ICD-10-CM

## 2011-08-27 DIAGNOSIS — N760 Acute vaginitis: Secondary | ICD-10-CM

## 2011-08-27 DIAGNOSIS — Z9889 Other specified postprocedural states: Secondary | ICD-10-CM

## 2011-08-27 DIAGNOSIS — N920 Excessive and frequent menstruation with regular cycle: Secondary | ICD-10-CM | POA: Insufficient documentation

## 2011-08-27 DIAGNOSIS — N946 Dysmenorrhea, unspecified: Secondary | ICD-10-CM

## 2011-08-27 LAB — CBC
MCV: 90.5 fL (ref 78.0–100.0)
Platelets: 291 10*3/uL (ref 150–400)
RBC: 4.32 MIL/uL (ref 3.87–5.11)
RDW: 13.6 % (ref 11.5–15.5)
WBC: 9.1 10*3/uL (ref 4.0–10.5)

## 2011-08-27 LAB — TSH: TSH: 1.217 u[IU]/mL (ref 0.350–4.500)

## 2011-08-27 MED ORDER — DICLOFENAC SODIUM 75 MG PO TBEC: 75.0000 mg | DELAYED_RELEASE_TABLET | Freq: Two times a day (BID) | ORAL | Status: AC

## 2011-08-27 NOTE — Telephone Encounter (Signed)
Called pt and spoke with pts "friend" and was given # [phone] to contact. Called pt at above # and pt informed me that she had surgery a year and it is now "starting to have pain where I had surgery and I want to make an appt."  I stated that she is correct for calling to make an appt so that she could evaluated.  I transferred pt to [facility] front desk, to schedule an appt. Pt had no further questions.

## 2011-08-27 NOTE — Patient Instructions (Signed)

## 2011-08-27 NOTE — Progress Notes (Signed)
History:  36 y.o. G0 here today for discussion about menorrhagia.  Patient is s/p abdominal myomectomy in 08/2010.  She was on Depo Provera then OCPs which she stopped 3 months ago.  Since then, her periods have become increasingly heavier, lasting 3-5 days with clots.  Patient feels that she loses a lot of blood, feels tired and lightheaded with associated abdominal cramping.  Wants to get pregnant soon.   Also complains of having "yeast infections" recently.   No current bleeding.  The following portions of the patient's history were reviewed and updated as appropriate: allergies, current medications, past family history, past medical history, past social history, past surgical history and problem list.  Review of Systems:  Pertinent items are noted in HPI.  Objective:  Physical Exam Blood pressure 141/92, pulse 58, temperature 97.3 F (36.3 C), temperature source Oral, height 5\' 4"  (1.626 m), weight 164 lb 1.6 oz (74.435 kg), last menstrual period 08/23/2011. Gen: NAD Abd: Soft, nontender and nondistended Pelvic: Normal appearing external genitalia; normal appearing vaginal mucosa and cervix.  Brown discharge, wet prep and GC/Chlam seen.  Small uterus, no other palpable masses, no uterine or adnexal tenderness  Assessment & Plan:  Patient has abnormal uterine bleeding . She has a normal exam, no evidence of lesions.  GC/Chlam, vaginal cultures obtained to rule out infection, will follow up the results. Will order serum CBC, TSH to evaluate for other reasons for abnormal bleeding. Patient will be referred for pelvic ultrasound to evaluate any structural abnormalities (fibroids vs polyps).  Bleeding precautions reviewed. Return to clinic after these studies to discuss results.

## 2011-08-27 NOTE — Telephone Encounter (Signed)
Patient called at 1310, and stated she has a flat tire. Appointment at 1330, and needed to have until 1400. Told patient provider would not wait that long, and I could reschedule her. She stated she would try to make it, or call back if she couldn't.

## 2011-08-27 NOTE — Telephone Encounter (Signed)
Spoke with "a friend" and he stated he was driving to her house and should be there in the next half hour.  I stated that I would call back in 30 min.  He stated that would be good.

## 2011-08-28 ENCOUNTER — Ambulatory Visit (HOSPITAL_COMMUNITY)
Admission: RE | Admit: 2011-08-28 | Discharge: 2011-08-28 | Disposition: A | Discharge status: Home / Self Care | Payer: Self-pay | Source: Ambulatory Visit | Attending: Obstetrics & Gynecology | Admitting: Obstetrics & Gynecology

## 2011-08-28 ENCOUNTER — Telehealth: Payer: Self-pay

## 2011-08-28 DIAGNOSIS — N949 Unspecified condition associated with female genital organs and menstrual cycle: Secondary | ICD-10-CM | POA: Insufficient documentation

## 2011-08-28 DIAGNOSIS — N920 Excessive and frequent menstruation with regular cycle: Secondary | ICD-10-CM | POA: Insufficient documentation

## 2011-08-28 DIAGNOSIS — D251 Intramural leiomyoma of uterus: Secondary | ICD-10-CM | POA: Insufficient documentation

## 2011-08-28 LAB — WET PREP, GENITAL
Trich, Wet Prep: NONE SEEN
Yeast Wet Prep HPF POC: NONE SEEN

## 2011-08-28 LAB — GC/CHLAMYDIA PROBE AMP, GENITAL: GC Probe Amp, Genital: NEGATIVE

## 2011-08-28 MED ORDER — METRONIDAZOLE 500 MG PO TABS: 500.0000 mg | ORAL_TABLET | Freq: Two times a day (BID) | ORAL | Status: AC

## 2011-08-28 NOTE — Progress Notes (Signed)
Addended by: Jaynie Collins A on: 08/28/2011 09:20 AM   Modules accepted: Orders

## 2011-08-28 NOTE — Telephone Encounter (Signed)
Tereso Newcomer, MD 08/28/2011 9:20 AM Signed  Addended by: Jaynie Collins A on: 08/28/2011 09:20 AM  Modules accepted: Rockne Menghini, MD 08/28/2011 9:19 AM Addendum  Mellody Drown prep showed clue cells. Metronidazole e-prescribed. Patient will be called and informed of diagnosis, and instructed to pick up prescription.  Also inform patient she is not anemic, she has normal TSH, other labs were normal.  Called pt and left message to return our call to clinics.

## 2011-08-28 NOTE — Progress Notes (Addendum)
Wet prep showed clue cells.  Metronidazole e-prescribed.  Patient will be called and informed of diagnosis, and instructed to pick up prescription.  Also inform patient she is not anemic, she has normal TSH, other labs were normal.

## 2011-09-03 NOTE — Telephone Encounter (Signed)
Called and left a message for patient to return our call/

## 2011-09-03 NOTE — Telephone Encounter (Signed)
Paige Rowe called and left a message returning our message. Called Paige Rowe back and informed her has BV and has prescription waiting at pharmacy. Discussed some ways to decrease incidence of BV( patient states had before).  Also informed her per Dr. Sarina Ill that labs, including tsh normal . Patient voices understanding.

## 2011-09-10 ENCOUNTER — Ambulatory Visit: Payer: Self-pay | Admitting: Obstetrics & Gynecology

## 2012-07-29 ENCOUNTER — Ambulatory Visit: Payer: Self-pay | Admitting: Obstetrics & Gynecology

## 2012-08-09 ENCOUNTER — Other Ambulatory Visit (HOSPITAL_COMMUNITY)
Admission: RE | Admit: 2012-08-09 | Discharge: 2012-08-09 | Disposition: A | Discharge status: Home / Self Care | Payer: Self-pay | Source: Ambulatory Visit | Attending: Emergency Medicine | Admitting: Emergency Medicine

## 2012-08-09 ENCOUNTER — Emergency Department (INDEPENDENT_AMBULATORY_CARE_PROVIDER_SITE_OTHER)
Admission: EM | Admit: 2012-08-09 | Discharge: 2012-08-09 | Disposition: A | Discharge status: Home / Self Care | Payer: Self-pay | Source: Home / Self Care

## 2012-08-09 ENCOUNTER — Encounter (HOSPITAL_COMMUNITY): Payer: Self-pay | Admitting: Emergency Medicine

## 2012-08-09 DIAGNOSIS — F17200 Nicotine dependence, unspecified, uncomplicated: Secondary | ICD-10-CM

## 2012-08-09 DIAGNOSIS — N760 Acute vaginitis: Secondary | ICD-10-CM

## 2012-08-09 DIAGNOSIS — A499 Bacterial infection, unspecified: Secondary | ICD-10-CM

## 2012-08-09 DIAGNOSIS — N72 Inflammatory disease of cervix uteri: Secondary | ICD-10-CM

## 2012-08-09 DIAGNOSIS — J309 Allergic rhinitis, unspecified: Secondary | ICD-10-CM

## 2012-08-09 DIAGNOSIS — B9689 Other specified bacterial agents as the cause of diseases classified elsewhere: Secondary | ICD-10-CM

## 2012-08-09 DIAGNOSIS — Z113 Encounter for screening for infections with a predominantly sexual mode of transmission: Secondary | ICD-10-CM | POA: Insufficient documentation

## 2012-08-09 DIAGNOSIS — Z720 Tobacco use: Secondary | ICD-10-CM

## 2012-08-09 LAB — POCT URINALYSIS DIP (DEVICE)
Ketones, ur: NEGATIVE mg/dL
Protein, ur: NEGATIVE mg/dL
Specific Gravity, Urine: 1.025 (ref 1.005–1.030)

## 2012-08-09 LAB — POCT RAPID STREP A: Streptococcus, Group A Screen (Direct): NEGATIVE

## 2012-08-09 MED ORDER — LIDOCAINE HCL (PF) 1 % IJ SOLN
INTRAMUSCULAR | Status: AC
  Filled 2012-08-09: qty 5

## 2012-08-09 MED ORDER — CEFTRIAXONE SODIUM 1 G IJ SOLR
500.0000 g | Freq: Once | INTRAMUSCULAR | Status: AC
  Administered 2012-08-09: 500 g via INTRAMUSCULAR

## 2012-08-09 MED ORDER — CEFTRIAXONE SODIUM 1 G IJ SOLR
INTRAMUSCULAR | Status: AC
  Filled 2012-08-09: qty 10

## 2012-08-09 MED ORDER — AZITHROMYCIN 500 MG PO TABS: 1000.0000 mg | ORAL_TABLET | Freq: Once | ORAL | Status: DC

## 2012-08-09 MED ORDER — METRONIDAZOLE 500 MG PO TABS: 500.0000 mg | ORAL_TABLET | Freq: Two times a day (BID) | ORAL | Status: DC

## 2012-08-09 NOTE — ED Notes (Signed)
Pt c/o sore throat, runny nose and productive cough x 1 week. Has been coughing up large chunks of phelgm and blood. Also has urinary frquency and foul smelling discharge. Has tried cranberry tabs and juice with no relief. Patient is alert and oriented.

## 2012-08-09 NOTE — ED Provider Notes (Signed)
History     CSN: [id number]  Arrival date & time 08/09/12  1416   None     Chief Complaint  Patient presents with  . URI  . Vaginal Discharge    (Consider location/radiation/quality/duration/timing/severity/associated sxs/prior treatment) HPI Comments: 37-year-old female with multiple complaints to include sore throat, cough, dark thick sputum, PND for 2-3 days. Is also complaining of a vaginal discharge for one week. States it is malodorous thin and the cough white in color. She denies dysuria but has urinated urgency and low volume voids. She has a history of allergic rhinitis and is a smoker.   History reviewed. No pertinent past medical history.  Past Surgical History  Procedure Laterality Date  . Myomectomy  may 2012    History reviewed. No pertinent family history.  History  Substance Use Topics  . Smoking status: Current Every Day Smoker -- 0.25 packs/day for 10 years    Types: Cigarettes  . Smokeless tobacco: Never Used  . Alcohol Use: No    OB History   Grav Para Term Preterm Abortions TAB SAB Ect Mult Living   0 0 0 0 0 0 0 0 0 0       Review of Systems  Constitutional: Negative.  Negative for fever.  HENT: Positive for congestion, sore throat, rhinorrhea, neck pain, postnasal drip and sinus pressure. Negative for hearing loss, ear pain, nosebleeds, facial swelling, neck stiffness, tinnitus and ear discharge.   Eyes: Negative.   Respiratory: Positive for cough. Negative for shortness of breath.   Gastrointestinal: Negative.   Genitourinary: Positive for urgency, vaginal discharge and pelvic pain. Negative for dysuria, flank pain, vaginal bleeding and vaginal pain.  Skin: Negative.   Neurological: Negative.     Allergies  Latex  Home Medications   Current Outpatient Rx  Name  Route  Sig  Dispense  Refill  . azithromycin (ZITHROMAX) 500 MG tablet   Oral   Take 2 tablets (1,000 mg total) by mouth once.   2 tablet   0   . diclofenac (VOLTAREN) 75  MG EC tablet   Oral   Take 1 tablet (75 mg total) by mouth 2 (two) times daily with a meal.   60 tablet   2   . medroxyPROGESTERone (DEPO-PROVERA) 150 MG/ML injection   Intramuscular   Inject 150 mg into the muscle every 3 (three) months.           . metroNIDAZOLE (FLAGYL) 500 MG tablet   Oral   Take 1 tablet (500 mg total) by mouth 2 (two) times daily. X 7 days   14 tablet   0   . Multiple Vitamin (MULTIVITAMIN) capsule   Oral   Take 1 capsule by mouth daily.           Marland Kitchen EXPIRED: norgestimate-ethinyl estradiol (SPRINTEC 28) 0.25-35 MG-MCG tablet   Oral   Take 1 tablet by mouth daily.   1 Package   11     LMP 07/22/2012  Physical Exam  Nursing note and vitals reviewed. Constitutional: She is oriented to person, place, and time. She appears well-developed and well-nourished. No distress.  HENT:  Mouth/Throat: Oropharynx is clear and moist. No oropharyngeal exudate.  Bilateral TMs are normal Positive for clear PND  Eyes: EOM are normal. Right eye exhibits no discharge. Left eye exhibits no discharge.  Neck: Normal range of motion. Neck supple.  Cardiovascular: Normal rate, regular rhythm and normal heart sounds.   Pulmonary/Chest: Effort normal and breath sounds normal. No respiratory  distress. She has no wheezes.  Abdominal: Soft. She exhibits no distension. There is no tenderness.  Genitourinary:  Normal external female genitalia. Vaginal walls are coated with a thin white discharge. Cervix is position anteriorly. The ectocervix is free of lesions, pain covered with the discharge. No bleeding is seen. Bimanual: Positive cervical motion tenderness and mild bilateral adnexal tenderness.  Musculoskeletal: Normal range of motion. She exhibits no edema.  Lymphadenopathy:    She has no cervical adenopathy.  Neurological: She is alert and oriented to person, place, and time. No cranial nerve deficit.  Skin: Skin is warm and dry. No rash noted.  Psychiatric: She has a  normal mood and affect.    ED Course  Procedures (including critical care time)  Labs Reviewed  POCT URINALYSIS DIP (DEVICE) - Abnormal; Notable for the following:    Hgb urine dipstick MODERATE (*)    All other components within normal limits  POCT RAPID STREP A ([facility]ARE ONLY)  POCT PREGNANCY, URINE  CERVICOVAGINAL ANCILLARY ONLY   No results found.  Results for orders placed during the hospital encounter of 08/09/12  POCT URINALYSIS DIP (DEVICE)      Result Value Range   Glucose, UA NEGATIVE  NEGATIVE mg/dL   Bilirubin Urine NEGATIVE  NEGATIVE   Ketones, ur NEGATIVE  NEGATIVE mg/dL   Specific Gravity, Urine 1.025  1.005 - 1.030   Hgb urine dipstick MODERATE (*) NEGATIVE   pH 6.0  5.0 - 8.0   Protein, ur NEGATIVE  NEGATIVE mg/dL   Urobilinogen, UA 0.2  0.0 - 1.0 mg/dL   Nitrite NEGATIVE  NEGATIVE   Leukocytes, UA NEGATIVE  NEGATIVE  POCT RAPID STREP A ([facility] ONLY)      Result Value Range   Streptococcus, Group A Screen (Direct) NEGATIVE  NEGATIVE  POCT PREGNANCY, URINE      Result Value Range   Preg Test, Ur NEGATIVE  NEGATIVE      1. BV (bacterial vaginosis)   2. Cervicitis   3. Allergic rhinitis due to allergen   4. Tobacco abuse       MDM  Rocephin 500 mg IM Azithromycin 1000 mg by mouth Flagyl 500 mg twice a day for 7 days Allegra 180 mg daily Robitussin-DM for cough Drink plenty of fluids stay well hydrated Stop smoking  Hayden Rasmussen, NP 08/09/12 1637

## 2012-08-12 NOTE — ED Notes (Signed)
GC/Chlamydia neg., Affirm: Gardnerella pos., Candida and Trich neg, Pt. adequately treated with Flagyl. Vassie Moselle 08/12/2012

## 2012-08-13 NOTE — ED Provider Notes (Signed)
Medical screening examination/treatment/procedure(s) were performed by resident physician or non-physician practitioner and as supervising physician I was immediately available for consultation/collaboration.   Barkley Bruns MD.   Linna Hoff, MD 08/13/12 [phone]

## 2012-09-12 IMAGING — US US TRANSVAGINAL NON-OB
1 series · 14 of 25 positions shown · non-contrast
Comparison: 05/19/2010

CLINICAL DATA: Pelvic pain.  Negative urine pregnancy test.
History of fibroid.

TRANSVAGINAL ULTRASOUND OF PELVIS
TECHNIQUE: Transvaginal ultrasound examination of the pelvis was
performed including evaluation of the uterus, ovaries, adnexal
regions, and pelvic cul-de-sac.

[Series 1: us transvaginal non-ob · 14 of 49 slices shown]
[im 1/49]
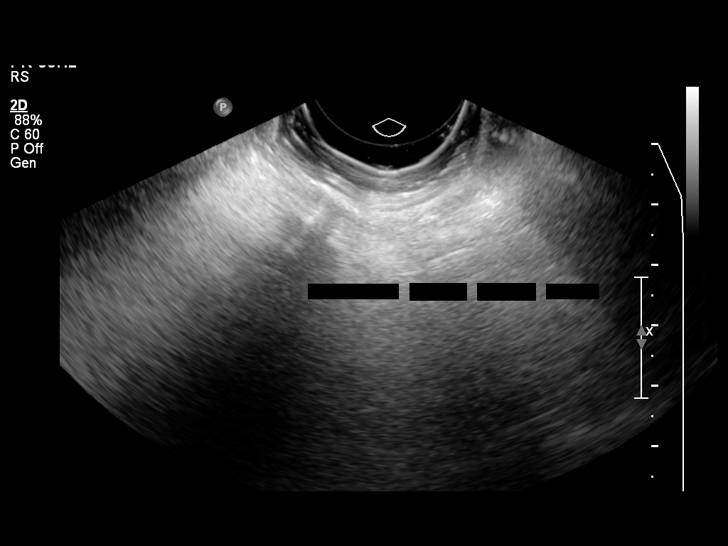
[im 5/49]
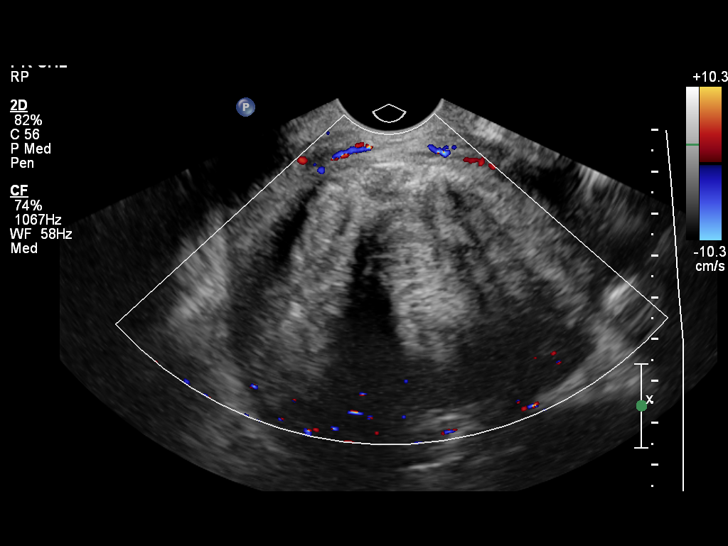
[im 9/49]
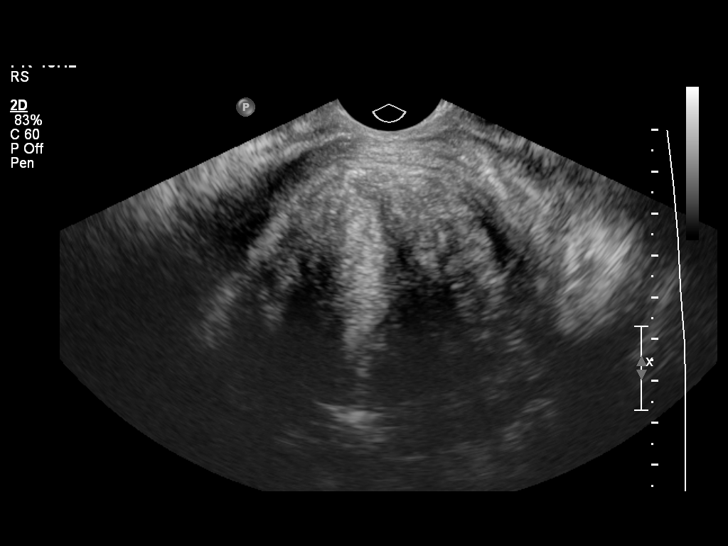
[im 13/49]
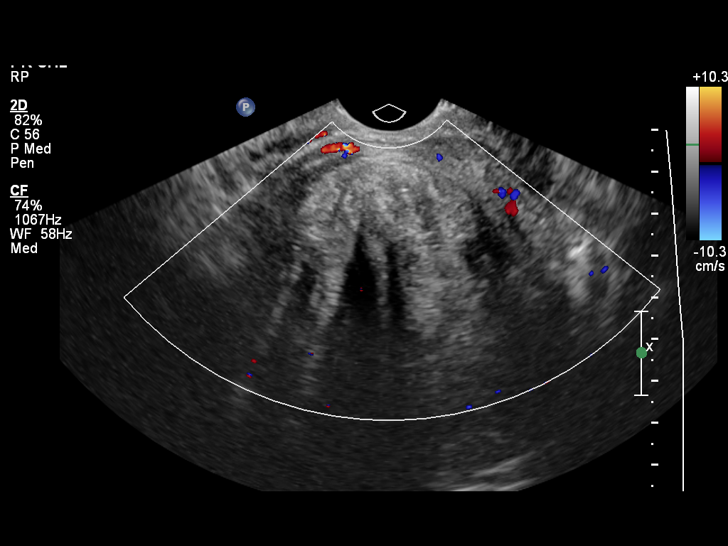
[im 17/49]
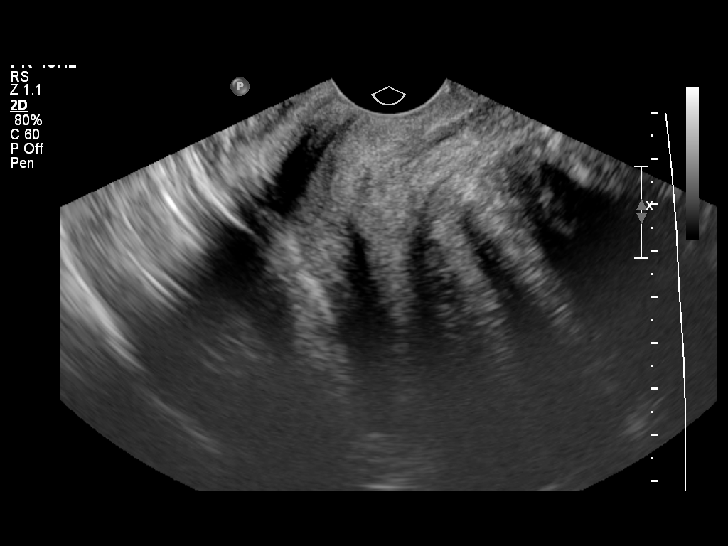
[im 19/49]
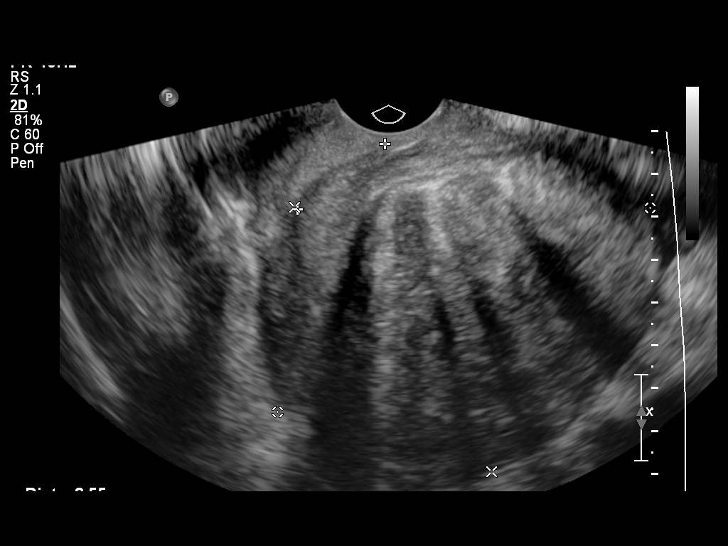
[im 23/49]
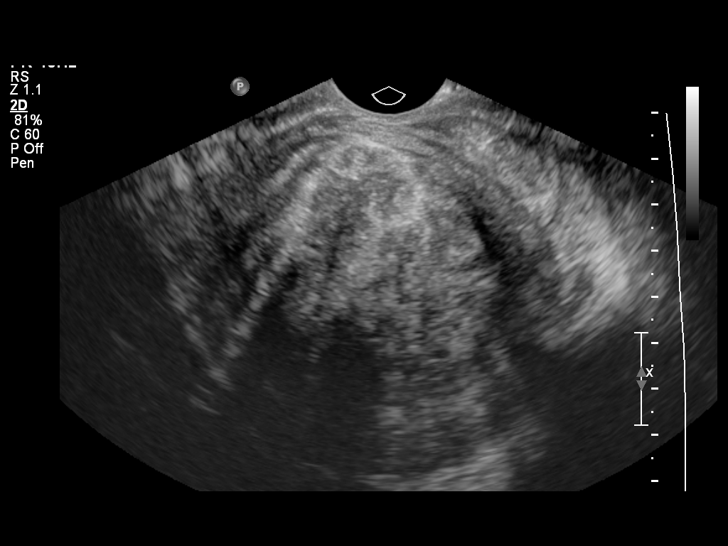
[im 27/49]
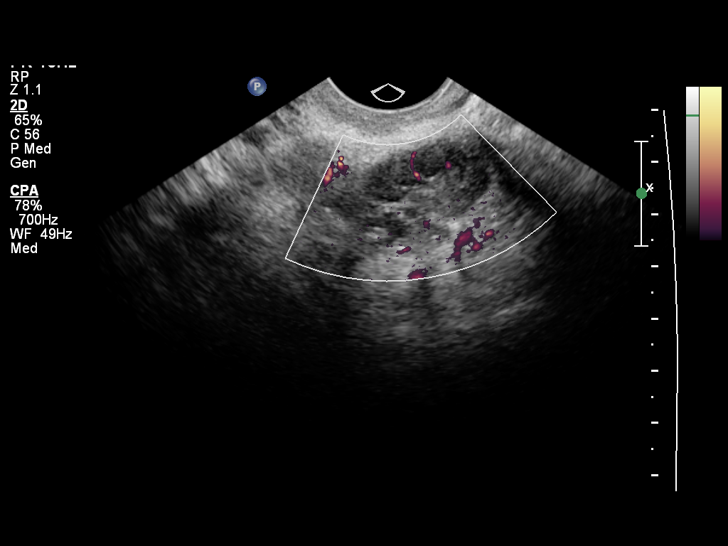
[im 31/49]
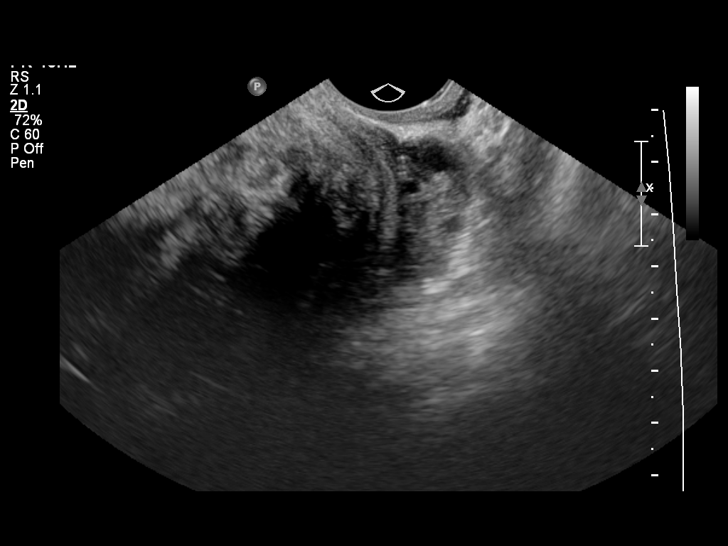
[im 33/49]
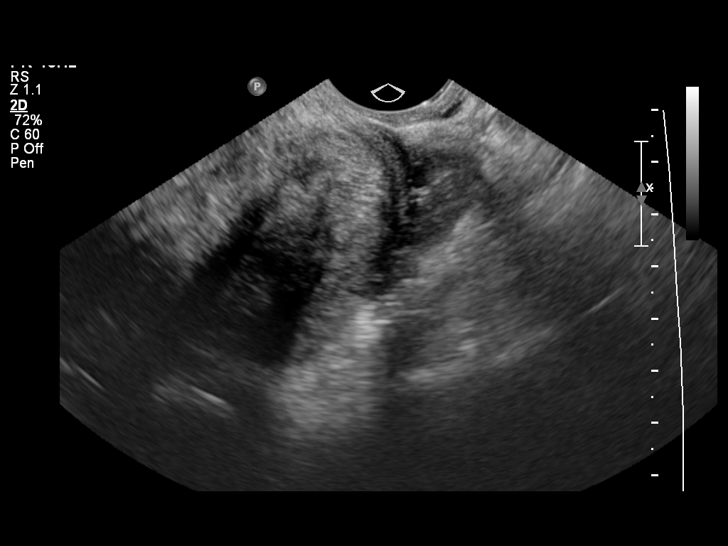
[im 37/49]
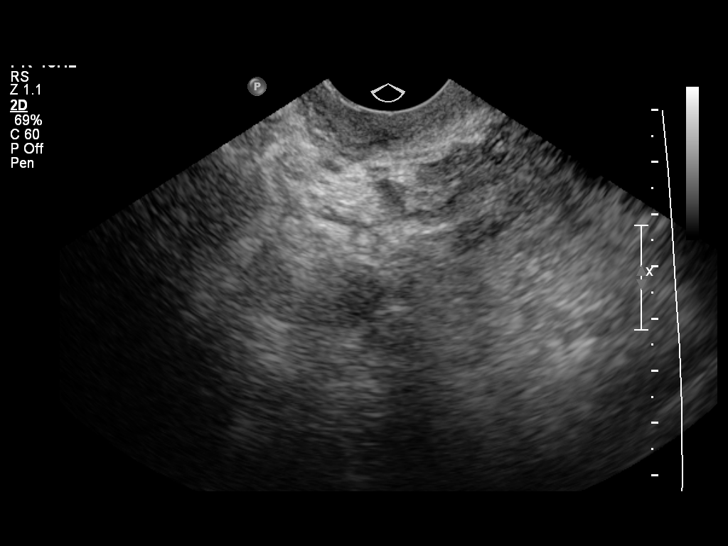
[im 41/49]
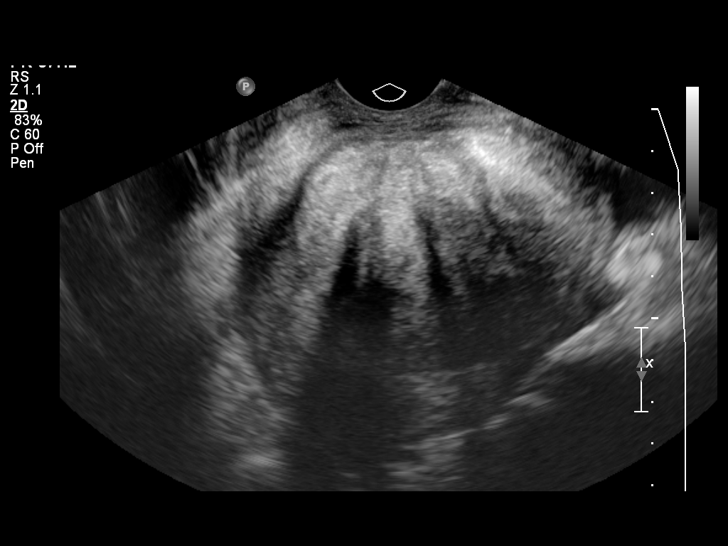
[im 45/49]
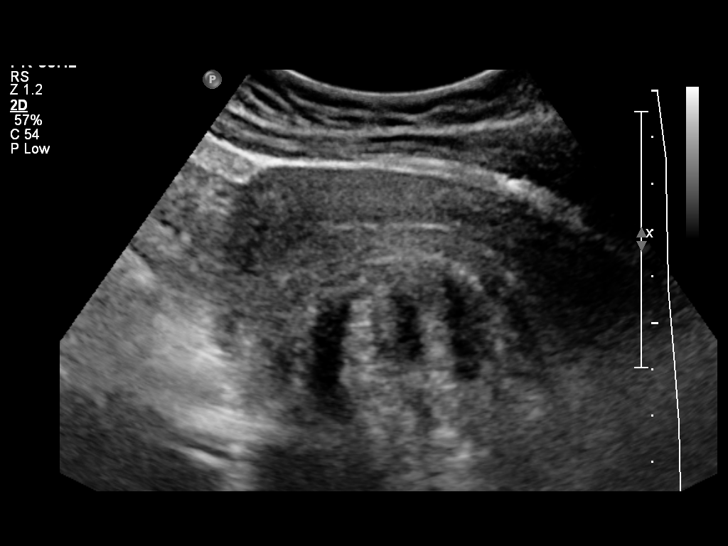
[im 49/49]
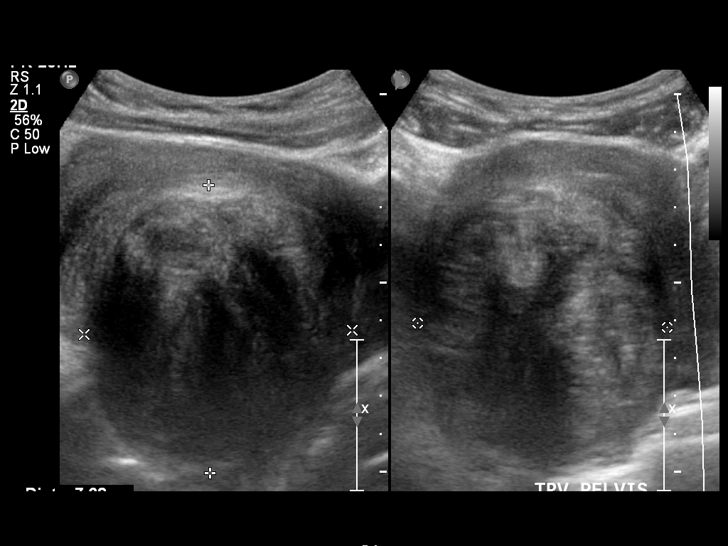

[14 of 25 positions shown; findings below may reference images not displayed]

FINDINGS: Uterus is retroverted and demonstrates a sagittal length of
cm, an AP depth of 9.9 cm and a transverse width of 8.3 cm.  A
right posterolateral upper uterine segment fibroid is identified
measuring 7.1 x 7.6 x 6.6 cm.  The remainder of the myometrium is
homogeneous.

Endometrium appears thin with an AP width of 4 mm.  No areas of
focal thickening or heterogeneity are identified.

Right Ovary is not seen with confidence

Left Ovary has a normal appearance measuring 3.2 x 2.1 by 1.4 cm.

Other Findings:  No pelvic fluid or separate adnexal masses are
seen.
IMPRESSION: Large focal fibroid sizes location as noted above.  Normal
endometrial lining and left ovary.  Non-visualized right ovary

## 2012-09-12 IMAGING — US US RENAL
1 series · 14 of 25 positions shown · non-contrast
Comparison: CT 05/28/2010

CLINICAL DATA: Left lower quadrant pain with history of
nephrolithiasis.  Urinary frequency.

RENAL/URINARY TRACT ULTRASOUND COMPLETE

[Series 1: us renal · 14 of 29 slices shown]
[im 1/29]
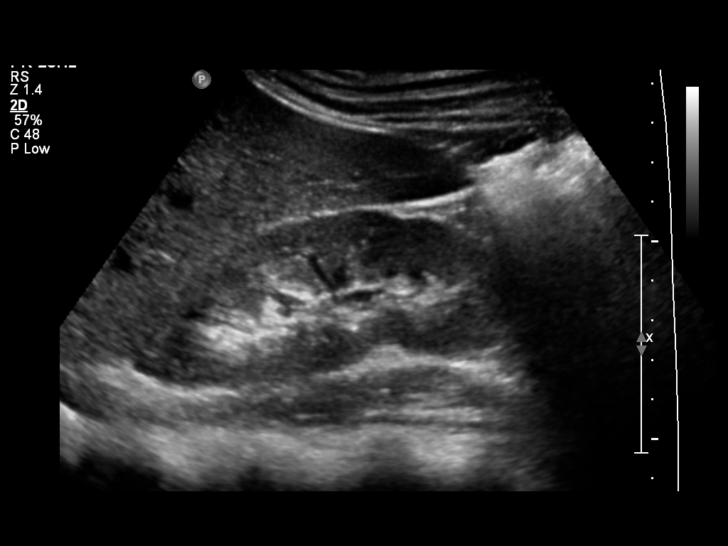
[im 3/29]
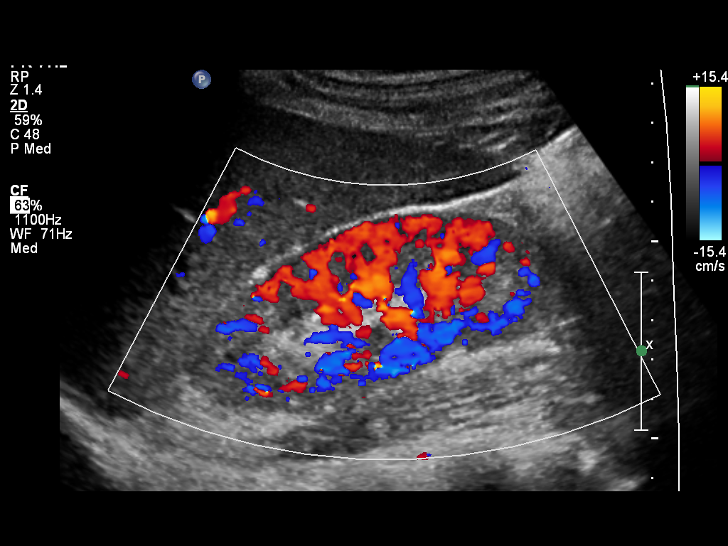
[im 5/29]
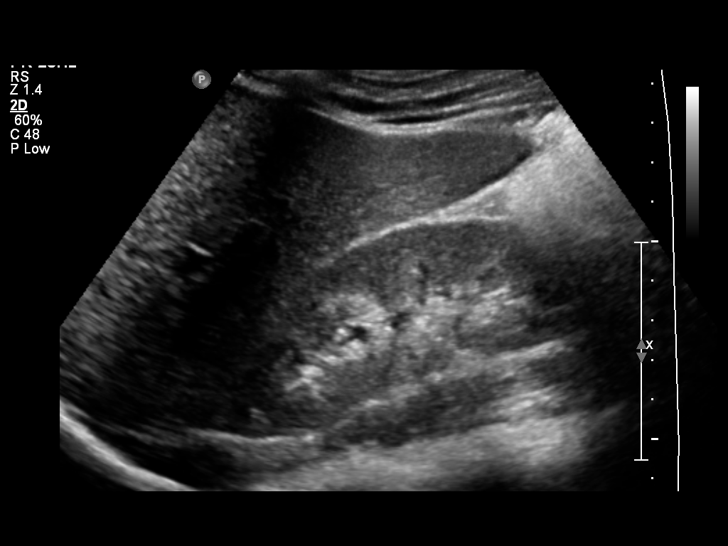
[im 8/29]
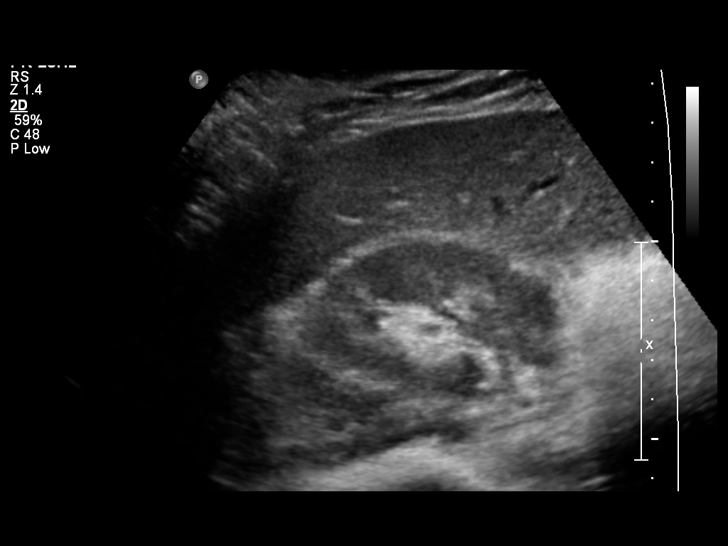
[im 10/29]
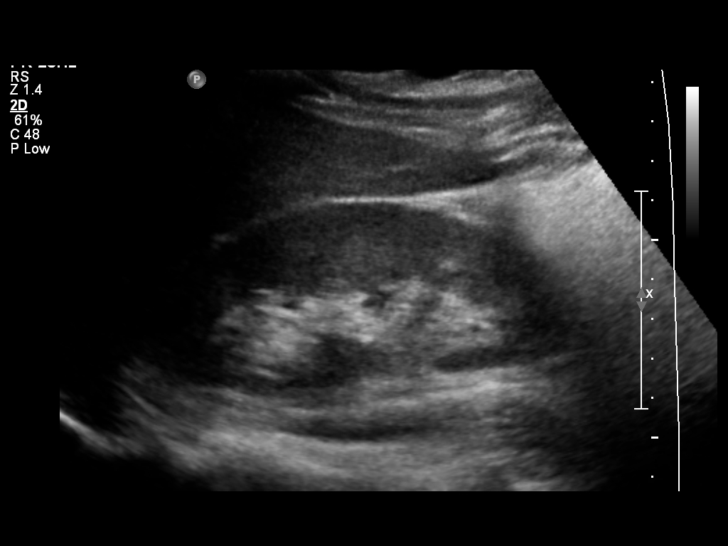
[im 11/29]
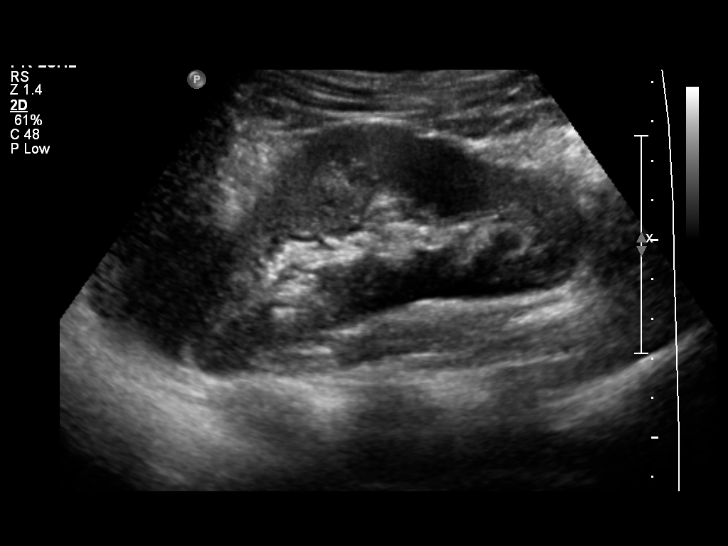
[im 13/29]
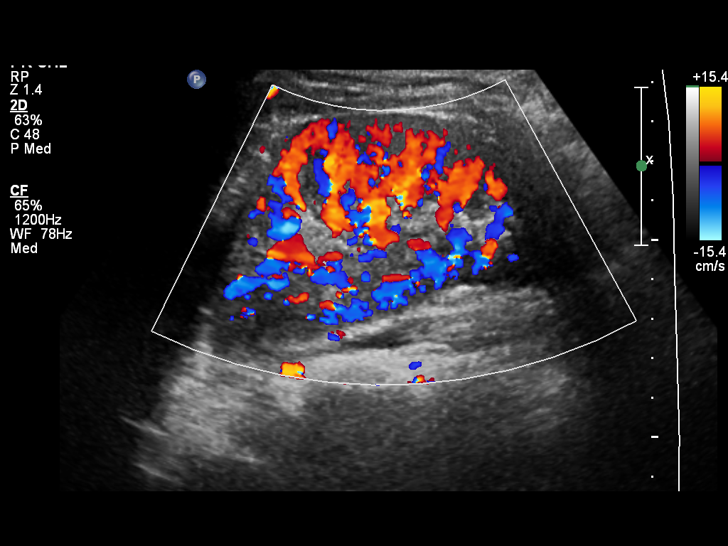
[im 16/29]
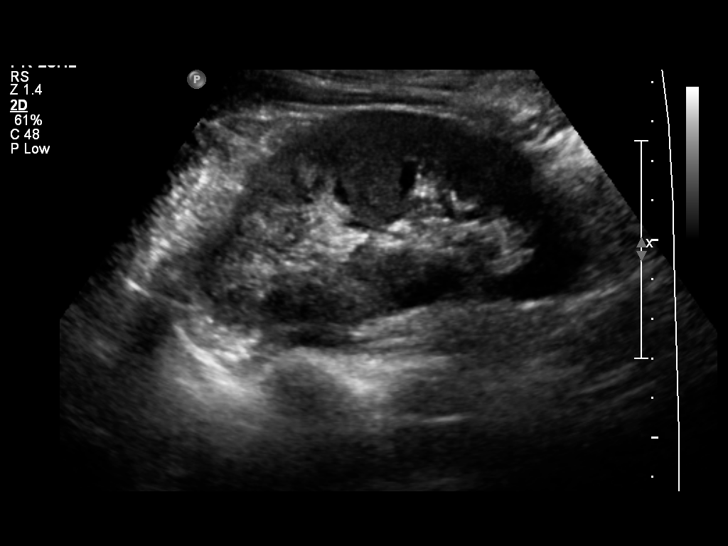
[im 18/29]
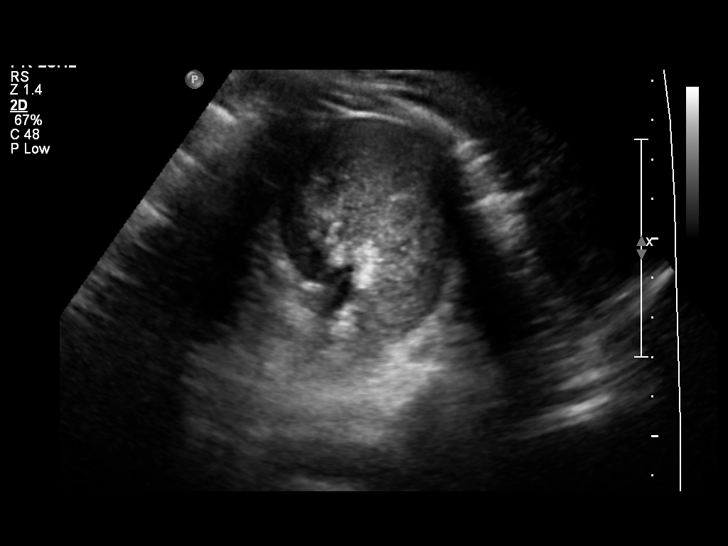
[im 19/29]
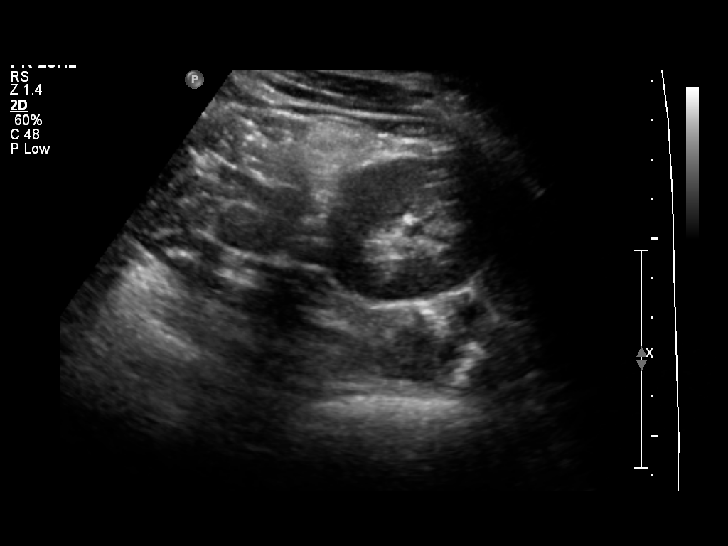
[im 22/29]
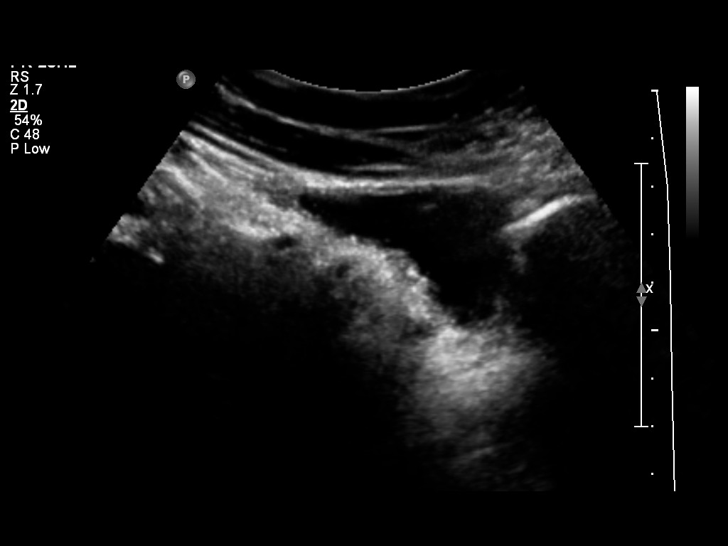
[im 24/29]
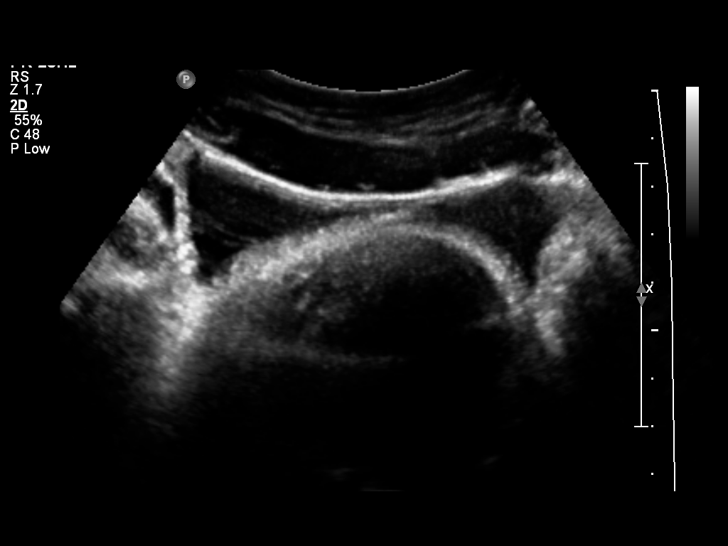
[im 26/29]
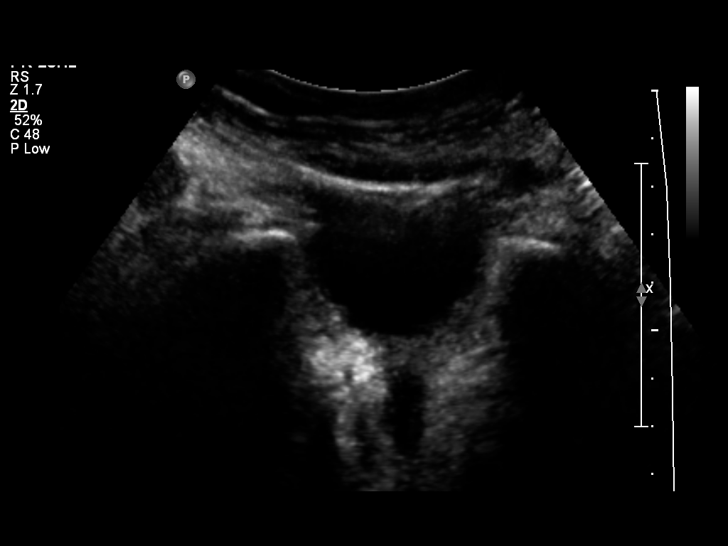
[im 29/29]
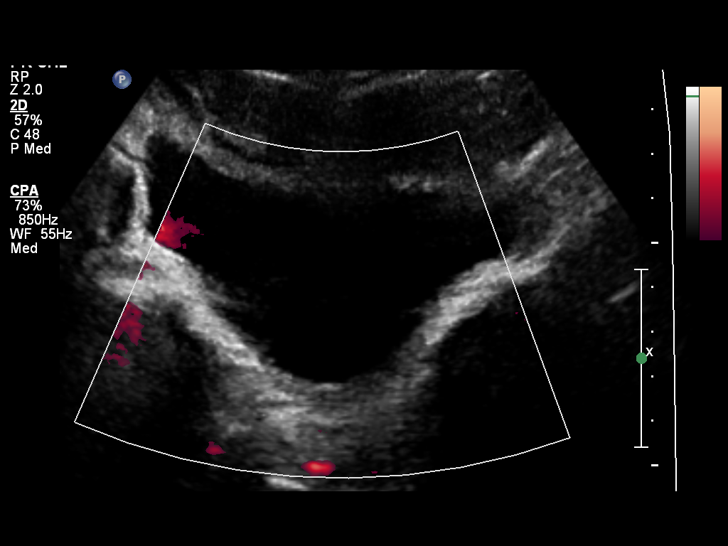

[14 of 25 positions shown; findings below may reference images not displayed]

FINDINGS: Right Kidney:  The right kidney demonstrates a sagittal length of
9.8 cm.  No focal parenchymal abnormalities or signs of
hydronephrosis are noted

Left Kidney:  The left kidney demonstrates a sagittal length of
10.3 cm.  No focal parenchymal abnormalities or signs of
hydronephrosis are seen.  The 2 mm calculus seen previously by CT
in the left upper pole is not seen with confidence but may not be
visualized due to the small size.

Bladder:  Is compressed by the patient's fibroid uterus.  The
presence of the fibroid uterus makes evaluation for ureteral jets
difficult.  The right ureteral jet was seen.  A left ureteral jet
was not confirmed.
IMPRESSION: Normal sonographic appearance to the kidneys.  No ureterectasis.
Right ureteral jet identified with a left ureteral jet not
confirmed but evaluation for ureteral jets was difficult due to the
presence of the fibroid uterus compressing the bladder.

## 2013-05-30 ENCOUNTER — Ambulatory Visit (INDEPENDENT_AMBULATORY_CARE_PROVIDER_SITE_OTHER): Payer: Self-pay | Admitting: Obstetrics & Gynecology

## 2013-05-30 ENCOUNTER — Encounter: Payer: Self-pay | Admitting: Obstetrics & Gynecology

## 2013-05-30 VITALS — BP 141/98 | HR 90 | Temp 98.1°F | Ht 64.0 in | Wt 158.2 lb

## 2013-05-30 DIAGNOSIS — Z9889 Other specified postprocedural states: Secondary | ICD-10-CM

## 2013-05-30 DIAGNOSIS — N920 Excessive and frequent menstruation with regular cycle: Secondary | ICD-10-CM

## 2013-05-30 MED ORDER — MEGESTROL ACETATE 40 MG PO TABS: 40.0000 mg | ORAL_TABLET | Freq: Two times a day (BID) | ORAL | Status: DC

## 2013-05-30 NOTE — Progress Notes (Signed)
   CLINIC ENCOUNTER NOTE  History:  38 y.o. G0P0000 here today for menorrhagia.  Patient is s/p abdominal myomectomy in 08/2010. She was on Depo Provera then OCPs which she discontinued after 3 months. Since then, her periods have become increasingly heavier, lasting 3-5 days with clots. Patient feels that she loses a lot of blood, feels tired and lightheaded with associated abdominal cramping. Wants to get pregnant soon.   The following portions of the patient's history were reviewed and updated as appropriate: allergies, current medications, past family history, past medical history, past social history, past surgical history and problem list.  Normal pap smears, last one in 12/2010.  Review of Systems:  Pertinent items are noted in HPI.  Objective:  BP 141/98  Pulse 90  Temp(Src) 98.1 F (36.7 C) (Oral)  Ht 5\' 4"  (1.626 m)  Wt 158 lb 3.2 oz (71.759 kg)  BMI 27.14 kg/m2  LMP 05/08/2013 Physical Exam deferred  Assessment & Plan:  Discussed management options for abnormal uterine bleeding including oral progesterone, Depo Provera, Mirena IUD, endometrial ablation (Novasure/Hydrothermal Ablation) or hysterectomy as definitive surgical management.  Discussed risks and benefits of each method. Patient desires oral progesterone. Megace taper prescribed as needed for now,  bleeding precautions reviewed.  Bleeding precautions reviewed Will order pelvic scan if bleeding continues, patient declines for now due to cost    [name], MD, [place] [facility] [place], [place]

## 2013-05-30 NOTE — Patient Instructions (Signed)
Menorrhagia  Menorrhagia is the medical term for when your menstrual periods are heavy or last longer than usual. With menorrhagia, every period you have may cause enough blood loss and cramping that you are unable to maintain your usual activities.  CAUSES   In some cases, the cause of heavy periods is unknown, but a number of conditions may cause menorrhagia. Common causes include:  · A problem with the hormone-producing thyroid gland (hypothyroid).  · Noncancerous growths in the uterus (polyps or fibroids).  · An imbalance of the estrogen and progesterone hormones.  · One of your ovaries not releasing an egg during one or more months.  · Side effects of having an intrauterine device (IUD).  · Side effects of some medicines, such as anti-inflammatory medicines or blood thinners.  · A bleeding disorder that stops your blood from clotting normally.  SIGNS AND SYMPTOMS   During a normal period, bleeding lasts between 4 and 8 days. Signs that your periods are too heavy include:  · You routinely have to change your pad or tampon every 1 or 2 hours because it is completely soaked.  · You pass blood clots larger than 1 inch (2.5 cm) in size.  · You have bleeding for more than 7 days.  · You need to use pads and tampons at the same time because of heavy bleeding.  · You need to wake up to change your pads or tampons during the night.  · You have symptoms of anemia, such as tiredness, fatigue, or shortness of breath.   DIAGNOSIS   Your health care provider will perform a physical exam and ask you questions about your symptoms and menstrual history. Other tests may be ordered based on what the health care provider finds during the exam. These tests can include:  · Blood tests To check if you are pregnant or have hormonal changes, a bleeding or thyroid disorder, low iron levels (anemia), or other problems.  · Endometrial biopsy Your health care provider takes a sample of tissue from the inside of your uterus to be examined  under a microscope.  · Pelvic ultrasound This test uses sound waves to make a picture of your uterus, ovaries, and vagina. The pictures can show if you have fibroids or other growths.  · Hysteroscopy For this test, your health care provider will use a small telescope to look inside your uterus.  Based on the results of your initial tests, your health care provider may recommend further testing.  TREATMENT   Treatment may not be needed. If it is needed, your health care provider may recommend treatment with one or more medicines first. If these do not reduce bleeding enough, a surgical treatment might be an option. The best treatment for you will depend on:   · Whether you need to prevent pregnancy.    · Your desire to have children in the future.  · The cause and severity of your bleeding.  · Your opinion and personal preference.    Medicines for menorrhagia may include:  · Birth control methods that use hormones These include the pill, skin patch, vaginal ring, shots that you get every 3 months, hormonal IUD, and implant. These treatments reduce bleeding during your menstrual period.  · Medicines that thicken blood and slow bleeding.  · Medicines that reduce swelling, such as ibuprofen.   · Medicines that contain a synthetic hormone called progestin.    · Medicines that make the ovaries stop working for a short time.    You may need surgical   treatment for menorrhagia if the medicines are unsuccessful. Treatment options include:  · Dilation and curettage (D&C) In this procedure, your health care provider opens (dilates) your cervix and then scrapes or suctions tissue from the lining of your uterus to reduce menstrual bleeding.  · Operative hysteroscopy This procedure uses a tiny tube with a light (hysteroscope) to view your uterine cavity and can help in the surgical removal of a polyp that may be causing heavy periods.  · Endometrial ablation Through various techniques, your health care provider permanently  destroys the entire lining of your uterus (endometrium). After endometrial ablation, most women have little or no menstrual flow. Endometrial ablation reduces your ability to become pregnant.  · Endometrial resection This surgical procedure uses an electrosurgical wire loop to remove the lining of the uterus. This procedure also reduces your ability to become pregnant.  · Hysterectomy Surgical removal of the uterus and cervix is a permanent procedure that stops menstrual periods. Pregnancy is not possible after a hysterectomy. This procedure requires anesthesia and hospitalization.  HOME CARE INSTRUCTIONS   · Only take over-the-counter or prescription medicines as directed by your health care provider. Take prescribed medicines exactly as directed. Do not change or switch medicines without consulting your health care provider.  · Take any prescribed iron pills exactly as directed by your health care provider. Long-term heavy bleeding may result in low iron levels. Iron pills help replace the iron your body lost from heavy bleeding. Iron may cause constipation. If this becomes a problem, increase the bran, fruits, and roughage in your diet.  · Do not take aspirin or medicines that contain aspirin 1 week before or during your menstrual period. Aspirin may make the bleeding worse.  · If you need to change your sanitary pad or tampon more than once every 2 hours, stay in bed and rest as much as possible until the bleeding stops.  · Eat well-balanced meals. Eat foods high in iron. Examples are leafy green vegetables, meat, liver, eggs, and whole grain breads and cereals. Do not try to lose weight until the abnormal bleeding has stopped and your blood iron level is back to normal.  SEEK MEDICAL CARE IF:   · You soak through a pad or tampon every 1 or 2 hours, and this happens every time you have a period.  · You need to use pads and tampons at the same time because you are bleeding so much.  · You need to change your pad  or tampon during the night.  · You have a period that lasts for more than 8 days.  · You pass clots bigger than 1 inch wide.  · You have irregular periods that happen more or less often than once a month.  · You feel dizzy or faint.  · You feel very weak or tired.  · You feel short of breath or feel your heart is beating too fast when you exercise.  · You have nausea and vomiting or diarrhea while you are taking your medicine.  · You have any problems that may be related to the medicine you are taking.  SEEK IMMEDIATE MEDICAL CARE IF:   · You soak through 4 or more pads or tampons in 2 hours.  · You have any bleeding while you are pregnant.  MAKE SURE YOU:   · Understand these instructions.  · Will watch your condition.  · Will get help right away if you are not doing well or get worse.    Document Released: 03/24/2005 Document Revised: 01/12/2013 Document Reviewed: 09/12/2012  [employer]® Patient Information ©2014 [employer].

## 2013-05-31 ENCOUNTER — Telehealth: Payer: Self-pay

## 2013-05-31 NOTE — Telephone Encounter (Signed)
Pt. Called stating a prescription was supposed to be sent to her pharmacy but had not been when she went to pick it up. [facility] [address]. And confirmed that the prescription for Megace was received. Called pt. To inform her that prescription for Megace had been received and she should not have a problem filling it. Pt. Verbalizes understanding and gratitude no other needs at this time.

## 2013-06-20 ENCOUNTER — Telehealth: Payer: Self-pay | Admitting: *Deleted

## 2013-06-20 NOTE — Telephone Encounter (Signed)
Called Paige Rowe- she reports she started the megace abour 06/04/13 and her bleeding stopped almost immediately,, then no bleeding for a few days, then restarted bleeding- so now bleeding about 10 days.  States her bleeding is less than it was before she was on megestrol, but period lasting longer and cramping more. States changing pad maybe 3-4 times a day.She denies dizziness, but c/o feeling weaker.  States she was told to wait until she was on megace for 14 days and then if still problem to call.  Informed Paige Rowe it does not sound like her bleeding is too heavy, but we did review when to come to [facility] for  Heavy bleeding.  We discussed I understand she is bleeding longer than before and she does not want to bleed longer. I informed her Dr. Harolyn Rutherford is not in clinic today, but I will send her a message via computer and we will get back to her after instructions from Dr. Harolyn Rutherford. Paige Rowe voices understanding.

## 2013-06-20 NOTE — Telephone Encounter (Signed)
Paige Rowe called and left a message stating she is calling regarding a prescription she got from clinic.  States she has called before 2 or 3 times.  States she is having heavy bleeding from this medicine and it is not stopping

## 2013-06-21 NOTE — Telephone Encounter (Addendum)
Patient can increase Megace to 40 mg po tid until bleeding stops then go back down to Megace 40 mg po bid; she can then stay on this maintenance dose until further evaluation and management.  If she is feeling very lightheaded or symptomatic in any other way, she should report to [facility] for further evaluation.  If bleeding continues to be concerning, patient will need further evaluation with pelvic ultrasound and possible endometrial biopsy.  [name], MD, [place] [facility] [place], [place]  I called pt and informed her of recommendations from Dr. Harolyn Rutherford to increase Megace to 40 mg po TID until bleeding stops, then resume Megace 40mg  po BID.  Pt should go to [facility] if she has severe weakness or becomes lightheaded. Pt also advised to call us back if her bleeding continues to be of concern as she will then need pelvic ultrasound and clinic appt.  Pt voiced understanding.

## 2013-12-28 ENCOUNTER — Emergency Department (HOSPITAL_COMMUNITY)
Admission: EM | Admit: 2013-12-28 | Discharge: 2013-12-28 | Disposition: A | Discharge status: Home / Self Care | Payer: Self-pay | Attending: Emergency Medicine | Admitting: Emergency Medicine

## 2013-12-28 ENCOUNTER — Encounter (HOSPITAL_COMMUNITY): Payer: Self-pay | Admitting: Emergency Medicine

## 2013-12-28 ENCOUNTER — Emergency Department (HOSPITAL_COMMUNITY): Payer: Self-pay

## 2013-12-28 DIAGNOSIS — F17200 Nicotine dependence, unspecified, uncomplicated: Secondary | ICD-10-CM | POA: Insufficient documentation

## 2013-12-28 DIAGNOSIS — Z79899 Other long term (current) drug therapy: Secondary | ICD-10-CM | POA: Insufficient documentation

## 2013-12-28 DIAGNOSIS — Y9289 Other specified places as the place of occurrence of the external cause: Secondary | ICD-10-CM | POA: Insufficient documentation

## 2013-12-28 DIAGNOSIS — Y9389 Activity, other specified: Secondary | ICD-10-CM | POA: Insufficient documentation

## 2013-12-28 DIAGNOSIS — IMO0002 Reserved for concepts with insufficient information to code with codable children: Secondary | ICD-10-CM | POA: Insufficient documentation

## 2013-12-28 DIAGNOSIS — M79672 Pain in left foot: Secondary | ICD-10-CM

## 2013-12-28 DIAGNOSIS — S8990XA Unspecified injury of unspecified lower leg, initial encounter: Secondary | ICD-10-CM | POA: Insufficient documentation

## 2013-12-28 DIAGNOSIS — Z792 Long term (current) use of antibiotics: Secondary | ICD-10-CM | POA: Insufficient documentation

## 2013-12-28 DIAGNOSIS — S99919A Unspecified injury of unspecified ankle, initial encounter: Principal | ICD-10-CM

## 2013-12-28 DIAGNOSIS — Z91040 Latex allergy status: Secondary | ICD-10-CM | POA: Insufficient documentation

## 2013-12-28 DIAGNOSIS — S99929A Unspecified injury of unspecified foot, initial encounter: Principal | ICD-10-CM

## 2013-12-28 MED ORDER — HYDROCODONE-ACETAMINOPHEN 5-325 MG PO TABS
2.0000 | ORAL_TABLET | Freq: Once | ORAL | Status: AC
  Administered 2013-12-28: 2 via ORAL
  Filled 2013-12-28: qty 2

## 2013-12-28 MED ORDER — HYDROCODONE-ACETAMINOPHEN 5-325 MG PO TABS: 1.0000 | ORAL_TABLET | ORAL | Status: DC | PRN

## 2013-12-28 MED ORDER — IBUPROFEN 800 MG PO TABS: 800.0000 mg | ORAL_TABLET | Freq: Three times a day (TID) | ORAL | Status: DC | PRN

## 2013-12-28 MED ORDER — IBUPROFEN 800 MG PO TABS
800.0000 mg | ORAL_TABLET | Freq: Once | ORAL | Status: AC
  Administered 2013-12-28: 800 mg via ORAL
  Filled 2013-12-28: qty 1

## 2013-12-28 NOTE — Discharge Instructions (Signed)
Contusion °A contusion is a deep bruise. Contusions are the result of an injury that caused bleeding under the skin. The contusion may turn blue, purple, or yellow. Minor injuries will give you a painless contusion, but more severe contusions may stay painful and swollen for a few weeks.  °CAUSES  °A contusion is usually caused by a blow, trauma, or direct force to an area of the body. °SYMPTOMS  °· Swelling and redness of the injured area. °· Bruising of the injured area. °· Tenderness and soreness of the injured area. °· Pain. °DIAGNOSIS  °The diagnosis can be made by taking a history and physical exam. An X-ray, CT scan, or MRI may be needed to determine if there were any associated injuries, such as fractures. °TREATMENT  °Specific treatment will depend on what area of the body was injured. In general, the best treatment for a contusion is resting, icing, elevating, and applying cold compresses to the injured area. Over-the-counter medicines may also be recommended for pain control. Ask your caregiver what the best treatment is for your contusion. °HOME CARE INSTRUCTIONS  °· Put ice on the injured area. °· Put ice in a plastic bag. °· Place a towel between your skin and the bag. °· Leave the ice on for 15-20 minutes, 3-4 times a day, or as directed by your health care provider. °· Only take over-the-counter or prescription medicines for pain, discomfort, or fever as directed by your caregiver. Your caregiver may recommend avoiding anti-inflammatory medicines (aspirin, ibuprofen, and naproxen) for 48 hours because these medicines may increase bruising. °· Rest the injured area. °· If possible, elevate the injured area to reduce swelling. °SEEK IMMEDIATE MEDICAL CARE IF:  °· You have increased bruising or swelling. °· You have pain that is getting worse. °· Your swelling or pain is not relieved with medicines. °MAKE SURE YOU:  °· Understand these instructions. °· Will watch your condition. °· Will get help right  away if you are not doing well or get worse. °Document Released: 01/01/2005 Document Revised: 03/29/2013 Document Reviewed: 01/27/2011 °Exit[employer]® Patient Information ©2015 [employer]. This information is not intended to replace advice given to you by your health care provider. Make sure you discuss any questions you have with your health care provider. °RICE: Routine Care for Injuries °The routine care of many injuries includes Rest, Ice, Compression, and Elevation (RICE). °HOME CARE INSTRUCTIONS °· Rest is needed to allow your body to heal. Routine activities can usually be resumed when comfortable. Injured tendons and bones can take up to 6 weeks to heal. Tendons are the cord-like structures that attach muscle to bone. °· Ice following an injury helps keep the swelling down and reduces pain. °¨ Put ice in a plastic bag. °¨ Place a towel between your skin and the bag. °¨ Leave the ice on for 15-20 minutes, 3-4 times a day, or as directed by your health care provider. Do this while awake, for the first 24 to 48 hours. After that, continue as directed by your caregiver. °· Compression helps keep swelling down. It also gives support and helps with discomfort. If an elastic bandage has been applied, it should be removed and reapplied every 3 to 4 hours. It should not be applied tightly, but firmly enough to keep swelling down. Watch fingers or toes for swelling, bluish discoloration, coldness, numbness, or excessive pain. If any of these problems occur, remove the bandage and reapply loosely. Contact your caregiver if these problems continue. °· Elevation helps reduce swelling   and decreases pain. With extremities, such as the arms, hands, legs, and feet, the injured area should be placed near or above the level of the heart, if possible. °SEEK IMMEDIATE MEDICAL CARE IF: °· You have persistent pain and swelling. °· You develop redness, numbness, or unexpected weakness. °· Your symptoms are getting worse rather than  improving after several days. °These symptoms may indicate that further evaluation or further X-rays are needed. Sometimes, X-rays may not show a small broken bone (fracture) until 1 week or 10 days later. Make a follow-up appointment with your caregiver. Ask when your X-ray results will be ready. Make sure you get your X-ray results. °Document Released: 07/06/2000 Document Revised: 03/29/2013 Document Reviewed: 08/23/2010 °Exit[employer]® Patient Information ©2015 [employer]. This information is not intended to replace advice given to you by your health care provider. Make sure you discuss any questions you have with your health care provider. ° °

## 2013-12-28 NOTE — ED Notes (Signed)
Pt states that she stubbed her lt big toe last night around 10 pm.  C/o pain.  No deformity or swelling noted.

## 2013-12-28 NOTE — ED Provider Notes (Signed)
TIME SEEN: 10:10 AM  CHIEF COMPLAINT: Left foot pain  HPI: Patient is a 38 y.o. F with no significant past medical history who presents to the emergency department with left foot pain. She reports that she stubbed her toe last night and is having pain from her right ear all into her proximal foot. She denies any numbness, tingling or weakness. Unable to bear weight. No other injury. She did not fall or hit her head.  ROS: See HPI Constitutional: no fever  Eyes: no drainage  ENT: no runny nose   Cardiovascular:  no chest pain  Resp: no SOB  GI: no vomiting GU: no dysuria Integumentary: no rash  Allergy: no hives  Musculoskeletal: no leg swelling  Neurological: no slurred speech ROS otherwise negative  PAST MEDICAL HISTORY/PAST SURGICAL HISTORY:  History reviewed. No pertinent past medical history.  MEDICATIONS:  Prior to Admission medications   Medication Sig Start Date End Date Taking? Authorizing Provider  azithromycin (ZITHROMAX) 500 MG tablet Take 2 tablets (1,000 mg total) by mouth once. 08/09/12   Janne Napoleon, NP  medroxyPROGESTERone (DEPO-PROVERA) 150 MG/ML injection Inject 150 mg into the muscle every 3 (three) months.      Historical Provider, MD  megestrol (MEGACE) 40 MG tablet Take 1 tablet (40 mg total) by mouth 2 (two) times daily. 05/30/13   Osborne Oman, MD  Multiple Vitamin (MULTIVITAMIN) capsule Take 1 capsule by mouth daily.      Historical Provider, MD  norgestimate-ethinyl estradiol ([place] 28) 0.25-35 MG-MCG tablet Take 1 tablet by mouth daily. 12/13/10 12/13/11  Woodroe Mode, MD    ALLERGIES:  Allergies  Allergen Reactions  . Latex Itching    SOCIAL HISTORY:  History  Substance Use Topics  . Smoking status: Current Every Day Smoker -- 0.25 packs/day for 10 years    Types: Cigarettes  . Smokeless tobacco: Never Used  . Alcohol Use: No    FAMILY HISTORY: No family history on file.  EXAM: BP 140/96  Pulse 71  Temp(Src) 98 F (36.7 C) (Oral)   Resp 18  SpO2 100%  LMP 12/02/2013 CONSTITUTIONAL: Alert and oriented and responds appropriately to questions. Well-appearing; well-nourished HEAD: Normocephalic EYES: Conjunctivae clear, PERRL ENT: normal nose; no rhinorrhea; moist mucous membranes; pharynx without lesions noted NECK: Supple, no meningismus, no LAD  CARD: RRR; S1 and S2 appreciated; no murmurs, no clicks, no rubs, no gallops RESP: Normal chest excursion without splinting or tachypnea; breath sounds clear and equal bilaterally; no wheezes, no rhonchi, no rales,  ABD/GI: Normal bowel sounds; non-distended; soft, non-tender, no rebound, no guarding BACK:  The back appears normal and is non-tender to palpation, there is no CVA tenderness EXT: Patient is tender to palpation over her dorsal foot diffusely and left great toe with out obvious bony deformity or ecchymosis or swelling, there is 2+ DP pulses bilaterally, sensation to light touch intact diffusely, no ligamentous laxity, patient has no tenderness to palpation over the proximal fibular head, Normal ROM in all joints; otherwise extremities are non-tender to palpation; no edema; normal capillary refill; no cyanosis    SKIN: Normal color for age and race; warm NEURO: Moves all extremities equally PSYCH: The patient's mood and manner are appropriate. Grooming and personal hygiene are appropriate.  MEDICAL DECISION MAKING: Patient here with left foot pain after she stubbed her left great toe last night. Unable to bear weight. X-ray shows no fracture or dislocation. Patient is requesting that we discharge her with an Ace wrap for comfort.  She has crutches at home to use as needed. We'll discharge with prescriptions for Vicodin and ibuprofen. We'll have her elevate, rest and place ice on her foot as needed. Discussed return precautions. She verbalizes understanding and is comfortable with plan.       [place], DO 12/28/13 1134

## 2014-05-11 ENCOUNTER — Emergency Department (HOSPITAL_COMMUNITY)
Admission: EM | Admit: 2014-05-11 | Discharge: 2014-05-11 | Disposition: A | Discharge status: Home / Self Care | Payer: Self-pay | Attending: Emergency Medicine | Admitting: Emergency Medicine

## 2014-05-11 ENCOUNTER — Encounter (HOSPITAL_COMMUNITY): Payer: Self-pay | Admitting: Emergency Medicine

## 2014-05-11 DIAGNOSIS — S199XXA Unspecified injury of neck, initial encounter: Secondary | ICD-10-CM | POA: Insufficient documentation

## 2014-05-11 DIAGNOSIS — M62838 Other muscle spasm: Secondary | ICD-10-CM

## 2014-05-11 DIAGNOSIS — Z91040 Latex allergy status: Secondary | ICD-10-CM | POA: Insufficient documentation

## 2014-05-11 DIAGNOSIS — Y92410 Unspecified street and highway as the place of occurrence of the external cause: Secondary | ICD-10-CM | POA: Insufficient documentation

## 2014-05-11 DIAGNOSIS — Z79899 Other long term (current) drug therapy: Secondary | ICD-10-CM | POA: Insufficient documentation

## 2014-05-11 DIAGNOSIS — Z720 Tobacco use: Secondary | ICD-10-CM | POA: Insufficient documentation

## 2014-05-11 DIAGNOSIS — Y9389 Activity, other specified: Secondary | ICD-10-CM | POA: Insufficient documentation

## 2014-05-11 DIAGNOSIS — Y998 Other external cause status: Secondary | ICD-10-CM | POA: Insufficient documentation

## 2014-05-11 DIAGNOSIS — S0990XA Unspecified injury of head, initial encounter: Secondary | ICD-10-CM | POA: Insufficient documentation

## 2014-05-11 MED ORDER — METHOCARBAMOL 500 MG PO TABS: 500.0000 mg | ORAL_TABLET | Freq: Two times a day (BID) | ORAL | Status: DC
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
## 2014-05-11 NOTE — Discharge Instructions (Signed)
Take the prescribed medication as directed.  May wish to take this with motrin (may take up to 600-800mg  3x daily) Return to the ED for new or worsening symptoms.
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
## 2014-08-16 ENCOUNTER — Emergency Department (HOSPITAL_COMMUNITY)
Admission: EM | Admit: 2014-08-16 | Discharge: 2014-08-16 | Disposition: A | Discharge status: Home / Self Care | Payer: Self-pay | Attending: Emergency Medicine | Admitting: Emergency Medicine

## 2014-08-16 ENCOUNTER — Encounter (HOSPITAL_COMMUNITY): Payer: Self-pay | Admitting: Emergency Medicine

## 2014-08-16 DIAGNOSIS — B349 Viral infection, unspecified: Secondary | ICD-10-CM | POA: Insufficient documentation

## 2014-08-16 DIAGNOSIS — Z792 Long term (current) use of antibiotics: Secondary | ICD-10-CM | POA: Insufficient documentation

## 2014-08-16 DIAGNOSIS — M791 Myalgia, unspecified site: Secondary | ICD-10-CM

## 2014-08-16 DIAGNOSIS — Z720 Tobacco use: Secondary | ICD-10-CM | POA: Insufficient documentation

## 2014-08-16 DIAGNOSIS — Z3202 Encounter for pregnancy test, result negative: Secondary | ICD-10-CM | POA: Insufficient documentation

## 2014-08-16 DIAGNOSIS — Z79899 Other long term (current) drug therapy: Secondary | ICD-10-CM | POA: Insufficient documentation

## 2014-08-16 DIAGNOSIS — Z91040 Latex allergy status: Secondary | ICD-10-CM | POA: Insufficient documentation

## 2014-08-16 DIAGNOSIS — Z791 Long term (current) use of non-steroidal anti-inflammatories (NSAID): Secondary | ICD-10-CM | POA: Insufficient documentation

## 2014-08-16 LAB — URINALYSIS, ROUTINE W REFLEX MICROSCOPIC
Bilirubin Urine: NEGATIVE
GLUCOSE, UA: NEGATIVE mg/dL
Hgb urine dipstick: NEGATIVE
Ketones, ur: NEGATIVE mg/dL
LEUKOCYTES UA: NEGATIVE
Nitrite: NEGATIVE
PH: 7 (ref 5.0–8.0)
Protein, ur: NEGATIVE mg/dL
Specific Gravity, Urine: 1.021 (ref 1.005–1.030)
Urobilinogen, UA: 0.2 mg/dL (ref 0.0–1.0)

## 2014-08-16 LAB — POC URINE PREG, ED: PREG TEST UR: NEGATIVE

## 2014-08-16 LAB — RAPID STREP SCREEN (MED CTR MEBANE ONLY): STREPTOCOCCUS, GROUP A SCREEN (DIRECT): NEGATIVE

## 2014-08-16 NOTE — ED Notes (Signed)
Pt reports that she does not want to be evaluated to abdominal and flank pain.

## 2014-08-16 NOTE — ED Notes (Signed)
Pt c/o abdominal pain as well as flank pain right side. Pt does not want to stay to proceed with plan of care.

## 2014-08-16 NOTE — ED Notes (Signed)
Pt escorted to discharge window. Verbalized understanding discharge instructions. In no acute distress.   

## 2014-08-16 NOTE — ED Provider Notes (Signed)
CSN: [id number]     Arrival date & time 08/16/14  1253 History   First MD Initiated Contact with Patient 08/16/14 1407     Chief Complaint  Patient presents with  . Generalized Body Aches  . Sore Throat     (Consider location/radiation/quality/duration/timing/severity/associated sxs/prior Treatment) The history is provided by the patient.  Paige Rowe is a 39 y.o. female here presenting with myalgias, sore throat. Sore throat since yesterday, has some nonproductive cough as well. Patient also has some diffuse myalgias. Told the nurse outside she had abdominal pain and flank pain but states to me that all her muscles hurt. Also has some sinus congestion. Denies any vomiting or urinary symptoms. Denies any fevers. She was concerned she may have strep throat or flu.   History reviewed. No pertinent past medical history. Past Surgical History  Procedure Laterality Date  . Myomectomy  may 2012   No family history on file. History  Substance Use Topics  . Smoking status: Current Every Day Smoker -- 0.25 packs/day for 10 years    Types: Cigarettes  . Smokeless tobacco: Never Used  . Alcohol Use: No   OB History    Gravida Para Term Preterm AB TAB SAB Ectopic Multiple Living   0 0 0 0 0 0 0 0 0 0      Review of Systems  HENT: Positive for sore throat.   Musculoskeletal: Positive for myalgias.  All other systems reviewed and are negative.     Allergies  Latex  Home Medications   Prior to Admission medications   Medication Sig Start Date End Date Taking? Authorizing Provider  azithromycin (ZITHROMAX) 500 MG tablet Take 2 tablets (1,000 mg total) by mouth once. 08/09/12   Janne Napoleon, NP  HYDROcodone-acetaminophen (NORCO/VICODIN) 5-325 MG per tablet Take 1 tablet by mouth every 4 (four) hours as needed. 12/28/13   Kristen N Ward, DO  ibuprofen (ADVIL,MOTRIN) 800 MG tablet Take 1 tablet (800 mg total) by mouth every 8 (eight) hours as needed for mild pain. 12/28/13   Kristen N  Ward, DO  medroxyPROGESTERone (DEPO-PROVERA) 150 MG/ML injection Inject 150 mg into the muscle every 3 (three) months.      Historical Provider, MD  megestrol (MEGACE) 40 MG tablet Take 1 tablet (40 mg total) by mouth 2 (two) times daily. 05/30/13   Osborne Oman, MD  methocarbamol (ROBAXIN) 500 MG tablet Take 1 tablet (500 mg total) by mouth 2 (two) times daily. 05/11/14   Larene Pickett, PA-C  Multiple Vitamin (MULTIVITAMIN) capsule Take 1 capsule by mouth daily.      Historical Provider, MD  norgestimate-ethinyl estradiol ([place] 28) 0.25-35 MG-MCG tablet Take 1 tablet by mouth daily. 12/13/10 12/13/11  Woodroe Mode, MD   BP 141/96 mmHg  Pulse 61  Temp(Src) 98.3 F (36.8 C) (Oral)  Resp 18  SpO2 100%  LMP 07/27/2014 Physical Exam  Constitutional: She is oriented to person, place, and time. She appears well-developed and well-nourished.  HENT:  Head: Normocephalic.  OP slightly red, tonsils not enlarged   Eyes: Conjunctivae are normal. Pupils are equal, round, and reactive to light.  Neck: Normal range of motion. Neck supple.  Cardiovascular: Normal rate, regular rhythm and normal heart sounds.   Pulmonary/Chest: Effort normal and breath sounds normal. No respiratory distress. She has no wheezes. She has no rales.  Abdominal: Soft. Bowel sounds are normal. She exhibits no distension. There is no tenderness. There is no rebound and no guarding.  Musculoskeletal: Normal  range of motion. She exhibits no edema or tenderness.  Lymphadenopathy:    She has no cervical adenopathy.  Neurological: She is alert and oriented to person, place, and time. No cranial nerve deficit. Coordination normal.  Skin: Skin is warm and dry.  Psychiatric: She has a normal mood and affect. Her behavior is normal. Thought content normal.  Nursing note and vitals reviewed.   ED Course  Procedures (including critical care time) Labs Review Labs Reviewed  URINALYSIS, ROUTINE W REFLEX MICROSCOPIC - Abnormal;  Notable for the following:    APPearance CLOUDY (*)    All other components within normal limits  RAPID STREP SCREEN  CULTURE, GROUP A STREP  POC URINE PREG, ED    Imaging Review No results found.   EKG Interpretation None      MDM   Final diagnoses:  Viral syndrome  Myalgia    [name] is a 39 y.o. female here with myalgias, sore throat. Likely viral syndrome. Low centor score. Strep neg. UA nl. Recommend motrin, outpatient f/u.    Wandra Arthurs, MD 08/16/14 1726

## 2014-08-16 NOTE — ED Notes (Signed)
Pt c/o sore throat that started yesterday.  Pt states that she has a cough with phlegm.

## 2014-08-16 NOTE — ED Notes (Signed)
Pt is aware urine sample needed.  

## 2014-08-16 NOTE — Discharge Instructions (Signed)
Take motrin 600 mg every 6 hrs for myalgias.   Get some rest.   Stay hydrated.   Follow up with your doctor.   Return to ER if you have severe pain, fever, vomiting.

## 2014-08-18 LAB — CULTURE, GROUP A STREP: STREP A CULTURE: NEGATIVE

## 2015-09-13 ENCOUNTER — Emergency Department (HOSPITAL_COMMUNITY)
Admission: EM | Admit: 2015-09-13 | Discharge: 2015-09-13 | Disposition: A | Discharge status: Home / Self Care | Payer: Self-pay

## 2015-09-13 ENCOUNTER — Emergency Department (HOSPITAL_COMMUNITY): Payer: Self-pay

## 2015-09-13 ENCOUNTER — Encounter (HOSPITAL_COMMUNITY): Payer: Self-pay | Admitting: *Deleted

## 2015-09-13 DIAGNOSIS — Z791 Long term (current) use of non-steroidal anti-inflammatories (NSAID): Secondary | ICD-10-CM | POA: Insufficient documentation

## 2015-09-13 DIAGNOSIS — Y9301 Activity, walking, marching and hiking: Secondary | ICD-10-CM | POA: Insufficient documentation

## 2015-09-13 DIAGNOSIS — Y999 Unspecified external cause status: Secondary | ICD-10-CM | POA: Insufficient documentation

## 2015-09-13 DIAGNOSIS — Z79899 Other long term (current) drug therapy: Secondary | ICD-10-CM | POA: Insufficient documentation

## 2015-09-13 DIAGNOSIS — M545 Low back pain: Secondary | ICD-10-CM | POA: Insufficient documentation

## 2015-09-13 DIAGNOSIS — W109XXA Fall (on) (from) unspecified stairs and steps, initial encounter: Secondary | ICD-10-CM | POA: Insufficient documentation

## 2015-09-13 DIAGNOSIS — Z792 Long term (current) use of antibiotics: Secondary | ICD-10-CM | POA: Insufficient documentation

## 2015-09-13 DIAGNOSIS — Z79891 Long term (current) use of opiate analgesic: Secondary | ICD-10-CM | POA: Insufficient documentation

## 2015-09-13 DIAGNOSIS — Y929 Unspecified place or not applicable: Secondary | ICD-10-CM | POA: Insufficient documentation

## 2015-09-13 DIAGNOSIS — F17210 Nicotine dependence, cigarettes, uncomplicated: Secondary | ICD-10-CM | POA: Insufficient documentation

## 2015-09-13 DIAGNOSIS — Z91040 Latex allergy status: Secondary | ICD-10-CM | POA: Insufficient documentation

## 2015-09-13 MED ORDER — DIAZEPAM 2 MG PO TABS: 2.0000 mg | ORAL_TABLET | Freq: Four times a day (QID) | ORAL | Status: DC | PRN

## 2015-09-13 MED ORDER — IBUPROFEN 800 MG PO TABS
800.0000 mg | ORAL_TABLET | Freq: Once | ORAL | Status: AC
  Administered 2015-09-13: 800 mg via ORAL
  Filled 2015-09-13: qty 1

## 2015-09-13 MED ORDER — DIAZEPAM 2 MG PO TABS
2.0000 mg | ORAL_TABLET | Freq: Once | ORAL | Status: AC
  Administered 2015-09-13: 2 mg via ORAL
  Filled 2015-09-13: qty 1

## 2015-09-13 MED ORDER — ACETAMINOPHEN 500 MG PO TABS
1000.0000 mg | ORAL_TABLET | Freq: Once | ORAL | Status: AC
  Administered 2015-09-13: 1000 mg via ORAL
  Filled 2015-09-13: qty 2

## 2015-09-13 MED ORDER — OXYCODONE HCL 5 MG PO TABS
5.0000 mg | ORAL_TABLET | Freq: Once | ORAL | Status: AC
  Administered 2015-09-13: 5 mg via ORAL
  Filled 2015-09-13: qty 1

## 2015-09-13 NOTE — ED Notes (Signed)
Per EMS, pt fell today while going up her stairs. Pt complains of mid-upper back pain. Pt denies hitting head or loss of consciousness. Pt states she feels tingling in all of her extremities. Pt complains of 9/10 pain before receiving 197mcg fentanyl. Pain now 1/10.

## 2015-09-13 NOTE — ED Provider Notes (Signed)
CSN: [id number]     Arrival date & time 09/13/15  1615 History   First MD Initiated Contact with Patient 09/13/15 1629     Chief Complaint  Patient presents with  . Fall  . Back Pain     (Consider location/radiation/quality/duration/timing/severity/associated sxs/prior Treatment) Patient is a 40 y.o. female presenting with fall and back pain. The history is provided by the patient.  Fall This is a new problem. The current episode started less than 1 hour ago. The problem occurs constantly. The problem has not changed since onset.Pertinent negatives include no chest pain, no headaches and no shortness of breath. Nothing aggravates the symptoms. Nothing relieves the symptoms. She has tried nothing for the symptoms. The treatment provided no relief.  Back Pain Associated symptoms: no chest pain, no dysuria, no fever and no headaches    40 yo F With a chief complaints of left upper back pain. Patient was walking up the stairs when she tripped and fell onto an outstretched hand. Complaining of pain in her left trapezius area. Felt that was so severe that she could not get up and walks is called 911. Denies head injury denies loss of consciousness.  History reviewed. No pertinent past medical history. Past Surgical History  Procedure Laterality Date  . Myomectomy  may 2012   No family history on file. Social History  Substance Use Topics  . Smoking status: Current Every Day Smoker -- 0.25 packs/day for 10 years    Types: Cigarettes  . Smokeless tobacco: Never Used  . Alcohol Use: No   OB History    Gravida Para Term Preterm AB TAB SAB Ectopic Multiple Living   0 0 0 0 0 0 0 0 0 0      Review of Systems  Constitutional: Negative for fever and chills.  HENT: Negative for congestion and rhinorrhea.   Eyes: Negative for redness and visual disturbance.  Respiratory: Negative for shortness of breath and wheezing.   Cardiovascular: Negative for chest pain and palpitations.   Gastrointestinal: Negative for nausea and vomiting.  Genitourinary: Negative for dysuria and urgency.  Musculoskeletal: Positive for myalgias, back pain and arthralgias.  Skin: Negative for pallor and wound.  Neurological: Negative for dizziness and headaches.      Allergies  Latex  Home Medications   Prior to Admission medications   Medication Sig Start Date End Date Taking? Authorizing Provider  loratadine (CLARITIN) 10 MG tablet Take 10 mg by mouth daily as needed for allergies.   Yes Historical Provider, MD  azithromycin (ZITHROMAX) 500 MG tablet Take 2 tablets (1,000 mg total) by mouth once. Patient not taking: Reported on 09/13/2015 08/09/12   Janne Napoleon, NP  diazepam (VALIUM) 2 MG tablet Take 1 tablet (2 mg total) by mouth every 6 (six) hours as needed for anxiety. 09/13/15   Deno Etienne, DO  HYDROcodone-acetaminophen (NORCO/VICODIN) 5-325 MG per tablet Take 1 tablet by mouth every 4 (four) hours as needed. Patient not taking: Reported on 09/13/2015 12/28/13   Kristen N Ward, DO  ibuprofen (ADVIL,MOTRIN) 800 MG tablet Take 1 tablet (800 mg total) by mouth every 8 (eight) hours as needed for mild pain. Patient not taking: Reported on 09/13/2015 12/28/13   Delice Bison Ward, DO  megestrol (MEGACE) 40 MG tablet Take 1 tablet (40 mg total) by mouth 2 (two) times daily. Patient not taking: Reported on 09/13/2015 05/30/13   Osborne Oman, MD  methocarbamol (ROBAXIN) 500 MG tablet Take 1 tablet (500 mg total) by mouth 2 (  two) times daily. Patient not taking: Reported on 09/13/2015 05/11/14   Larene Pickett, PA-C  norgestimate-ethinyl estradiol (SPRINTEC 28) 0.25-35 MG-MCG tablet Take 1 tablet by mouth daily. 12/13/10 12/13/11  Woodroe Mode, MD   BP 145/106 mmHg  Pulse 60  Temp(Src) 97.9 F (36.6 C) (Oral)  Resp 18  SpO2 100%  LMP 08/20/2015 Physical Exam  Constitutional: She is oriented to person, place, and time. She appears well-developed and well-nourished. No distress.  HENT:  Head:  Normocephalic and atraumatic.  Eyes: EOM are normal. Pupils are equal, round, and reactive to light.  Neck: Normal range of motion. Neck supple.  Cardiovascular: Normal rate and regular rhythm.  Exam reveals no gallop and no friction rub.   No murmur heard. Pulmonary/Chest: Effort normal. She has no wheezes. She has no rales.  Abdominal: Soft. She exhibits no distension. There is no tenderness.  Musculoskeletal: She exhibits tenderness (left paraspinal musculature TTP). She exhibits no edema.  Neurological: She is alert and oriented to person, place, and time.  Skin: Skin is warm and dry. She is not diaphoretic.  Psychiatric: She has a normal mood and affect. Her behavior is normal.  Nursing note and vitals reviewed.   ED Course  Procedures (including critical care time) Labs Review Labs Reviewed - No data to display  Imaging Review Dg Thoracic Spine 4v  09/13/2015  CLINICAL DATA:  Back pain following fall today, initial encounter EXAM: THORACIC SPINE - 4+ VIEW COMPARISON:  None. FINDINGS: There is no evidence of thoracic spine fracture. Alignment is normal. No other significant bone abnormalities are identified. IMPRESSION: No acute abnormality noted. Electronically Signed   By: Inez Catalina M.D.   On: 09/13/2015 17:18   I have personally reviewed and evaluated these images and lab results as part of my medical decision-making.   EKG Interpretation None      MDM   Final diagnoses:  Left low back pain, with sciatica presence unspecified    40 yo F with a chief complaints of left trapezius tenderness. This is after a low level fall. Will obtain a plain film. Treat with tylenol and ibuprofen.  Xray negative, d/c home.   5:33 PM:  I have discussed the diagnosis/risks/treatment options with the patient and family and believe the pt to be eligible for discharge home to follow-up with PCP. We also discussed returning to the ED immediately if new or worsening sx occur. We discussed  the sx which are most concerning (e.g., sudden worsening pain, fever, inability to tolerate by mouth) that necessitate immediate return. Medications administered to the patient during their visit and any new prescriptions provided to the patient are listed below.  Medications given during this visit Medications  acetaminophen (TYLENOL) tablet 1,000 mg (1,000 mg Oral Given 09/13/15 1653)  ibuprofen (ADVIL,MOTRIN) tablet 800 mg (800 mg Oral Given 09/13/15 1653)  oxyCODONE (Oxy IR/ROXICODONE) immediate release tablet 5 mg (5 mg Oral Given 09/13/15 1653)  diazepam (VALIUM) tablet 2 mg (2 mg Oral Given 09/13/15 1653)    New Prescriptions   DIAZEPAM (VALIUM) 2 MG TABLET    Take 1 tablet (2 mg total) by mouth every 6 (six) hours as needed for anxiety.    The patient appears reasonably screen and/or stabilized for discharge and I doubt any other medical condition or other [facility] requiring further screening, evaluation, or treatment in the ED at this time prior to discharge.    Deno Etienne, DO 09/13/15 1733

## 2015-09-13 NOTE — Discharge Instructions (Signed)
Take 4 over the counter ibuprofen tablets 3 times a day or 2 over-the-counter naproxen tablets twice a day for pain. ° °Back Pain, Adult °Back pain is very common in adults. The cause of back pain is rarely dangerous and the pain often gets better over time. The cause of your back pain may not be known. Some common causes of back pain include: °· Strain of the muscles or ligaments supporting the spine. °· Wear and tear (degeneration) of the spinal disks. °· Arthritis. °· Direct injury to the back. °For many people, back pain may return. Since back pain is rarely dangerous, most people can learn to manage this condition on their own. °HOME CARE INSTRUCTIONS °Watch your back pain for any changes. The following actions may help to lessen any discomfort you are feeling: °· Remain active. It is stressful on your back to sit or stand in one place for long periods of time. Do not sit, drive, or stand in one place for more than 30 minutes at a time. Take short walks on even surfaces as soon as you are able. Try to increase the length of time you walk each day. °· Exercise regularly as directed by your health care provider. Exercise helps your back heal faster. It also helps avoid future injury by keeping your muscles strong and flexible. °· Do not stay in bed. Resting more than 1-2 days can delay your recovery. °· Pay attention to your body when you bend and lift. The most comfortable positions are those that put less stress on your recovering back. Always use proper lifting techniques, including: °¨ Bending your knees. °¨ Keeping the load close to your body. °¨ Avoiding twisting. °· Find a comfortable position to sleep. Use a firm mattress and lie on your side with your knees slightly bent. If you lie on your back, put a pillow under your knees. °· Avoid feeling anxious or stressed. Stress increases muscle tension and can worsen back pain. It is important to recognize when you are anxious or stressed and learn ways to  manage it, such as with exercise. °· Take medicines only as directed by your health care provider. Over-the-counter medicines to reduce pain and inflammation are often the most helpful. Your health care provider may prescribe muscle relaxant drugs. These medicines help dull your pain so you can more quickly return to your normal activities and healthy exercise. °· Apply ice to the injured area: °¨ Put ice in a plastic bag. °¨ Place a towel between your skin and the bag. °¨ Leave the ice on for 20 minutes, 2-3 times a day for the first 2-3 days. After that, ice and heat may be alternated to reduce pain and spasms. °· Maintain a healthy weight. Excess weight puts extra stress on your back and makes it difficult to maintain good posture. °SEEK MEDICAL CARE IF: °· You have pain that is not relieved with rest or medicine. °· You have increasing pain going down into the legs or buttocks. °· You have pain that does not improve in one week. °· You have night pain. °· You lose weight. °· You have a fever or chills. °SEEK IMMEDIATE MEDICAL CARE IF:  °· You develop new bowel or bladder control problems. °· You have unusual weakness or numbness in your arms or legs. °· You develop nausea or vomiting. °· You develop abdominal pain. °· You feel faint. °  °This information is not intended to replace advice given to you by your health care provider. Make sure you discuss any questions you have   with your health care provider. °  °Document Released: 03/24/2005 Document Revised: 04/14/2014 Document Reviewed: 07/26/2013 °Elsevier Interactive Patient Education ©2016 [employer] ° °

## 2016-10-09 ENCOUNTER — Emergency Department (HOSPITAL_COMMUNITY)
Admission: EM | Admit: 2016-10-09 | Discharge: 2016-10-09 | Disposition: A | Discharge status: Home / Self Care | Payer: Self-pay | Attending: Emergency Medicine | Admitting: Emergency Medicine

## 2016-10-09 ENCOUNTER — Encounter (HOSPITAL_COMMUNITY): Payer: Self-pay | Admitting: Emergency Medicine

## 2016-10-09 DIAGNOSIS — F17210 Nicotine dependence, cigarettes, uncomplicated: Secondary | ICD-10-CM | POA: Insufficient documentation

## 2016-10-09 DIAGNOSIS — B9689 Other specified bacterial agents as the cause of diseases classified elsewhere: Secondary | ICD-10-CM | POA: Insufficient documentation

## 2016-10-09 DIAGNOSIS — Z79899 Other long term (current) drug therapy: Secondary | ICD-10-CM | POA: Insufficient documentation

## 2016-10-09 DIAGNOSIS — R1030 Lower abdominal pain, unspecified: Secondary | ICD-10-CM | POA: Insufficient documentation

## 2016-10-09 DIAGNOSIS — N760 Acute vaginitis: Secondary | ICD-10-CM | POA: Insufficient documentation

## 2016-10-09 LAB — WET PREP, GENITAL
Sperm: NONE SEEN
Trich, Wet Prep: NONE SEEN
Yeast Wet Prep HPF POC: NONE SEEN

## 2016-10-09 LAB — URINALYSIS, ROUTINE W REFLEX MICROSCOPIC
Bilirubin Urine: NEGATIVE
GLUCOSE, UA: NEGATIVE mg/dL
Hgb urine dipstick: NEGATIVE
KETONES UR: 5 mg/dL — AB
Leukocytes, UA: NEGATIVE
Nitrite: NEGATIVE
Protein, ur: 30 mg/dL — AB
SPECIFIC GRAVITY, URINE: 1.02 (ref 1.005–1.030)
WBC, UA: NONE SEEN WBC/hpf (ref 0–5)
pH: 8 (ref 5.0–8.0)

## 2016-10-09 LAB — I-STAT BETA HCG BLOOD, ED (MC, WL, AP ONLY): I-stat hCG, quantitative: <5 m[IU]/mL (ref <5)

## 2016-10-09 LAB — CBC
HCT: 30.2 % — ABNORMAL LOW (ref 36.0–46.0)
Hemoglobin: 9.2 g/dL — ABNORMAL LOW (ref 12.0–15.0)
MCH: 22.9 pg — ABNORMAL LOW (ref 26.0–34.0)
MCHC: 30.5 g/dL (ref 30.0–36.0)
MCV: 75.1 fL — AB (ref 78.0–100.0)
PLATELETS: 401 10*3/uL — AB (ref 150–400)
RBC: 4.02 MIL/uL (ref 3.87–5.11)
RDW: 18 % — AB (ref 11.5–15.5)
WBC: 8.9 10*3/uL (ref 4.0–10.5)

## 2016-10-09 LAB — COMPREHENSIVE METABOLIC PANEL
ALK PHOS: 81 U/L (ref 38–126)
ALT: 12 U/L — ABNORMAL LOW (ref 14–54)
AST: 22 U/L (ref 15–41)
Albumin: 3.9 g/dL (ref 3.5–5.0)
Anion gap: 10 (ref 5–15)
BUN: 8 mg/dL (ref 6–20)
CO2: 25 mmol/L (ref 22–32)
CREATININE: 0.82 mg/dL (ref 0.44–1.00)
Calcium: 9.6 mg/dL (ref 8.9–10.3)
Chloride: 106 mmol/L (ref 101–111)
GFR calc non Af Amer: >60 mL/min (ref >60)
Glucose, Bld: 90 mg/dL (ref 65–99)
Potassium: 3.6 mmol/L (ref 3.5–5.1)
SODIUM: 141 mmol/L (ref 135–145)
Total Bilirubin: 0.8 mg/dL (ref 0.3–1.2)
Total Protein: 7.8 g/dL (ref 6.5–8.1)

## 2016-10-09 LAB — LIPASE, BLOOD: Lipase: 35 U/L (ref 11–51)

## 2016-10-09 LAB — GC/CHLAMYDIA PROBE AMP (~~LOC~~) NOT AT ARMC
CHLAMYDIA, DNA PROBE: NEGATIVE
Neisseria Gonorrhea: NEGATIVE

## 2016-10-09 MED ORDER — MORPHINE SULFATE (PF) 4 MG/ML IV SOLN
4.0000 mg | Freq: Once | INTRAVENOUS | Status: DC
  Filled 2016-10-09: qty 1

## 2016-10-09 MED ORDER — SODIUM CHLORIDE 0.9 % IV BOLUS (SEPSIS): 500.0000 mL | Freq: Once | INTRAVENOUS | Status: DC

## 2016-10-09 MED ORDER — ONDANSETRON HCL 4 MG/2ML IJ SOLN
4.0000 mg | Freq: Once | INTRAMUSCULAR | Status: DC
  Filled 2016-10-09: qty 2

## 2016-10-09 MED ORDER — ACETAMINOPHEN 500 MG PO TABS
1000.0000 mg | ORAL_TABLET | Freq: Once | ORAL | Status: AC
  Administered 2016-10-09: 1000 mg via ORAL
  Filled 2016-10-09: qty 2

## 2016-10-09 MED ORDER — IBUPROFEN 800 MG PO TABS
800.0000 mg | ORAL_TABLET | Freq: Once | ORAL | Status: AC
  Administered 2016-10-09: 800 mg via ORAL
  Filled 2016-10-09: qty 1

## 2016-10-09 MED ORDER — IBUPROFEN 800 MG PO TABS: 800.0000 mg | ORAL_TABLET | Freq: Three times a day (TID) | ORAL | 0 refills | Status: DC

## 2016-10-09 MED ORDER — METRONIDAZOLE 500 MG PO TABS: 500.0000 mg | ORAL_TABLET | Freq: Two times a day (BID) | ORAL | 0 refills | Status: DC

## 2016-10-09 NOTE — ED Triage Notes (Signed)
Arrives endorsing sudden onset anterior pelvic pain with radiation toward rectum. States "it feels like something is in my butt". Pain is constant with spasmodic exacerbations. Denies ability to be pregnant because of partial hysterectomy.

## 2016-10-09 NOTE — ED Provider Notes (Signed)
[address] DEPT Provider Note   CSN: [id number] Arrival date & time: 10/09/16  0023  By signing my name below, I, Collene Leyden, attest that this documentation has been prepared under the direction and in the presence of Jonluke Cobbins, Gwenyth Allegra, MD. Electronically Signed: Collene Leyden, Scribe. 10/09/16. 1:28 AM.  History   Chief Complaint Chief Complaint  Patient presents with  . Abdominal Pain   HPI Comments: Paige Rowe is a 41 y.o. female with a history of kidney stones, who presents to the Emergency Department complaining of sudden-onset abdominal pain that began one hour prior to arrival. Patient states initially she began having lower abdominal pain that has now radiated into her lower back. Patient states she thought she was experiencing gas, but her pain has persisted. Patient denies any abdominal pain at present. Patient does report a history of kidney stones in the past. Patient states her symptoms have mildly improved since onset. Patient reports associated nausea. No medications or any alleviating factors tried. Patient states her symptoms mildly improve when lying in a fetal position, nothing worsens her pain. Patient reports a surgical history of a myomectomy. Patient denies any vomiting, diarrhea, constipation, dysuria, urinary retention, fever, chills, or vaginal discharge.   The history is provided by the patient. No language interpreter was used.    History reviewed. No pertinent past medical history.  Patient Active Problem List   Diagnosis Date Noted  . Menorrhagia 08/27/2011  . Dysmenorrhea 08/27/2011  . S/P myomectomy on 08/19/10 08/27/2011    Past Surgical History:  Procedure Laterality Date  . MYOMECTOMY  may 2012    OB History    Gravida Para Term Preterm AB Living   0 0 0 0 0 0   SAB TAB Ectopic Multiple Live Births   0 0 0 0         Home Medications    Prior to Admission medications   Medication Sig Start Date End Date Taking? Authorizing  Provider  azithromycin (ZITHROMAX) 500 MG tablet Take 2 tablets (1,000 mg total) by mouth once. Patient not taking: Reported on 09/13/2015 08/09/12   Janne Napoleon, NP  diazepam (VALIUM) 2 MG tablet Take 1 tablet (2 mg total) by mouth every 6 (six) hours as needed for anxiety. 09/13/15   Deno Etienne, DO  HYDROcodone-acetaminophen (NORCO/VICODIN) 5-325 MG per tablet Take 1 tablet by mouth every 4 (four) hours as needed. Patient not taking: Reported on 09/13/2015 12/28/13   Ward, Delice Bison, DO  ibuprofen (ADVIL,MOTRIN) 800 MG tablet Take 1 tablet (800 mg total) by mouth every 8 (eight) hours as needed for mild pain. Patient not taking: Reported on 09/13/2015 12/28/13   Ward, Delice Bison, DO  loratadine (CLARITIN) 10 MG tablet Take 10 mg by mouth daily as needed for allergies.    [provider]  megestrol (MEGACE) 40 MG tablet Take 1 tablet (40 mg total) by mouth 2 (two) times daily. Patient not taking: Reported on 09/13/2015 05/30/13   Osborne Oman, MD  methocarbamol (ROBAXIN) 500 MG tablet Take 1 tablet (500 mg total) by mouth 2 (two) times daily. Patient not taking: Reported on 09/13/2015 05/11/14   Larene Pickett, PA-C  norgestimate-ethinyl estradiol ([place]) 0.25-35 MG-MCG tablet Take 1 tablet by mouth daily. 12/13/10 12/13/11  Woodroe Mode, MD    Family History History reviewed. No pertinent family history.  Social History Social History  Substance Use Topics  . Smoking status: Current Every Day Smoker    Packs/day: 0.25  Years: 10.00    Types: Cigarettes  . Smokeless tobacco: Never Used  . Alcohol use No     Allergies   Latex   Review of Systems Review of Systems  Constitutional: Negative for chills and fever.  Gastrointestinal: Positive for abdominal pain and nausea. Negative for constipation, diarrhea and vomiting.  Genitourinary: Negative for difficulty urinating, dysuria, flank pain, vaginal bleeding and vaginal discharge.  Musculoskeletal: Positive for back pain.  All  other systems reviewed and are negative.    Physical Exam Updated Vital Signs BP 138/87   Pulse 69   Temp 98.2 F (36.8 C) (Oral)   Resp 18   LMP 08/23/2016 (Exact Date)   SpO2 100%   Physical Exam  Constitutional: She is oriented to person, place, and time. She appears well-developed and well-nourished. No distress.  HENT:  Head: Normocephalic and atraumatic.  Right Ear: Hearing normal.  Left Ear: Hearing normal.  Nose: Nose normal.  Mouth/Throat: Oropharynx is clear and moist and mucous membranes are normal.  Eyes: Conjunctivae and EOM are normal. Pupils are equal, round, and reactive to light.  Neck: Normal range of motion. Neck supple.  Cardiovascular: Regular rhythm, S1 normal and S2 normal.  Exam reveals no gallop and no friction rub.   No murmur heard. Pulmonary/Chest: Effort normal and breath sounds normal. No respiratory distress. She exhibits no tenderness.  Abdominal: Soft. Normal appearance and bowel sounds are normal. There is no hepatosplenomegaly. There is tenderness. There is no rebound, no guarding, no tenderness at McBurney's point and negative Murphy's sign. No hernia.  Diffuse lower abdominal tenderness.   Genitourinary: Vagina normal. There is no rash or tenderness on the right labia. There is no rash or tenderness on the left labia. Uterus is not enlarged and not tender. Cervix exhibits no motion tenderness. Right adnexum displays no mass and no tenderness. Left adnexum displays no mass and no tenderness.  Musculoskeletal: Normal range of motion.  Neurological: She is alert and oriented to person, place, and time. She has normal strength. No cranial nerve deficit or sensory deficit. Coordination normal. GCS eye subscore is 4. GCS verbal subscore is 5. GCS motor subscore is 6.  Skin: Skin is warm, dry and intact. No rash noted. No cyanosis.  Psychiatric: She has a normal mood and affect. Her speech is normal and behavior is normal. Thought content normal.    Nursing note and vitals reviewed.    ED Treatments / Results  DIAGNOSTIC STUDIES: Oxygen Saturation is 100% on RA, normal by my interpretation.    COORDINATION OF CARE: 1:27 AM Discussed treatment plan with pt at bedside and pt agreed to plan, which includes pain medication and imaging.  Labs (all labs ordered are listed, but only abnormal results are displayed) Labs Reviewed  WET PREP, GENITAL - Abnormal; Notable for the following:       Result Value   Clue Cells Wet Prep HPF POC PRESENT (*)    WBC, Wet Prep HPF POC MODERATE (*)    All other components within normal limits  COMPREHENSIVE METABOLIC PANEL - Abnormal; Notable for the following:    ALT 12 (*)    All other components within normal limits  CBC - Abnormal; Notable for the following:    Hemoglobin 9.2 (*)    HCT 30.2 (*)    MCV 75.1 (*)    MCH 22.9 (*)    RDW 18.0 (*)    Platelets 401 (*)    All other components within normal limits  URINALYSIS, ROUTINE W REFLEX MICROSCOPIC - Abnormal; Notable for the following:    APPearance CLOUDY (*)    Ketones, ur 5 (*)    Protein, ur 30 (*)    Bacteria, UA RARE (*)    Squamous Epithelial / LPF 6-30 (*)    All other components within normal limits  LIPASE, BLOOD  I-STAT BETA HCG BLOOD, ED ([facility], [facility], [facility] ONLY)  GC/CHLAMYDIA PROBE AMP ([place]) NOT AT [facility]    EKG  EKG Interpretation None       Radiology No results found.  Procedures Procedures (including critical care time)  Medications Ordered in ED Medications  sodium chloride 0.9 % bolus 500 mL (0 mLs Intravenous Hold 10/09/16 0322)  acetaminophen (TYLENOL) tablet 1,000 mg (1,000 mg Oral Given 10/09/16 0155)     Initial Impression / Assessment and Plan / ED Course  I have reviewed the triage vital signs and the nursing notes.  Pertinent labs & imaging results that were available during my care of the patient were reviewed by me and considered in my medical decision making (see chart for details).      Patient presents to the emergency department for evaluation of abdominal pain. Patient reports that she had sudden onset of severe low abdominal pain earlier today. By the time she came to the ER, pain was starting to ease off. At time of evaluation she had some mild low back pain but abdominal pain had resolved. Examination revealed no tenderness or focality of her examination. Pelvic exam was unremarkable. Patient declined IV pain medicine and CAT scan. Her blood work is unremarkable. Urinalysis is unremarkable. As she is feeling much improvement, it is reasonable to discharge her and have her follow-up. She did have clue cells on her wet prep, will treat with Flagyl. GC chlamydia pending. No cervical motion tenderness on exam.  Final Clinical Impressions(s) / ED Diagnoses   Final diagnoses:  Lower abdominal pain  BV (bacterial vaginosis)    New Prescriptions New Prescriptions   No medications on file   I personally performed the services described in this documentation, which was scribed in my presence. The recorded information has been reviewed and is accurate.     Orpah Greek, MD 10/09/16 [phone]

## 2016-10-09 NOTE — ED Notes (Signed)
Pt requested tylenol and no morphine at this time. EDP aware. Verbal order 1,000mg  tylenol ordered.

## 2017-11-08 IMAGING — CR DG THORACIC SPINE 4+V
3 series · 3 of 3 positions shown · non-contrast
Comparison: None.

CLINICAL DATA: Back pain following fall today, initial encounter

EXAM:
THORACIC SPINE - 4+ VIEW

[t thoracic spine ap]
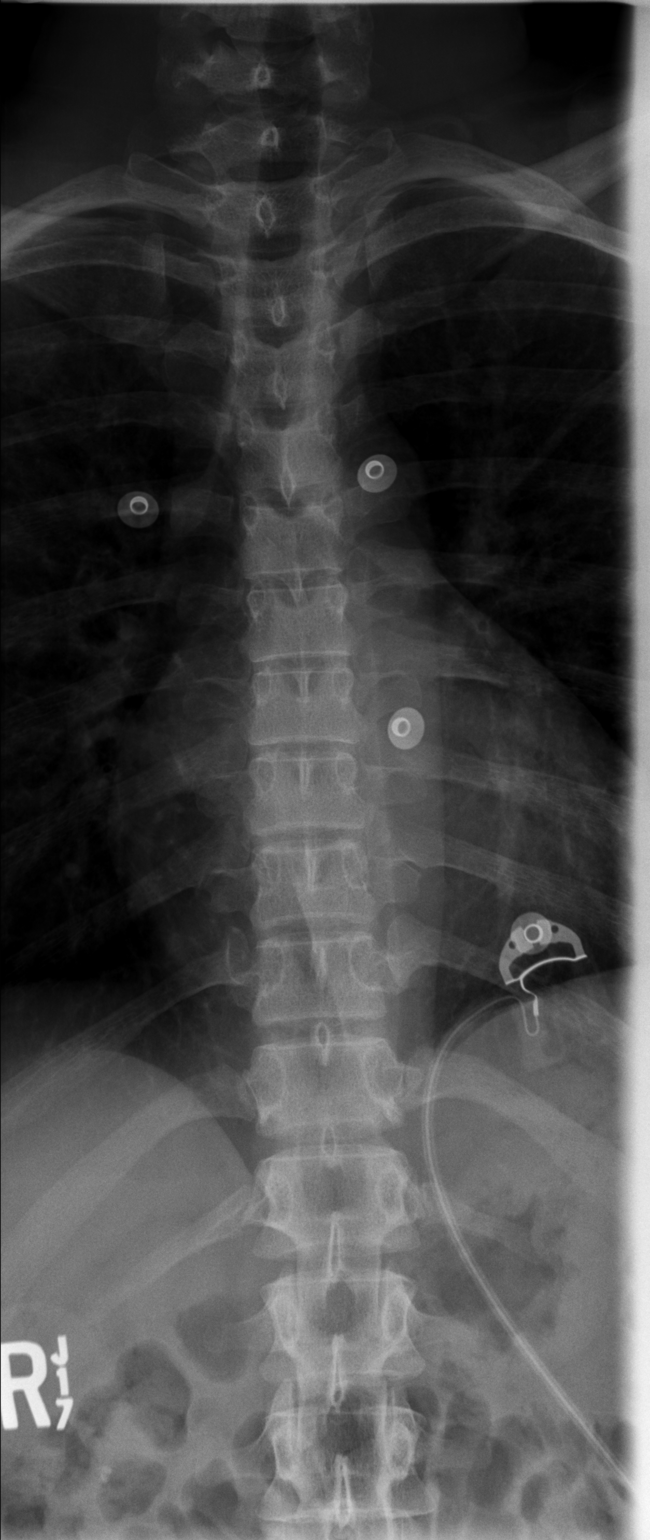

[t thoracic spine lat]
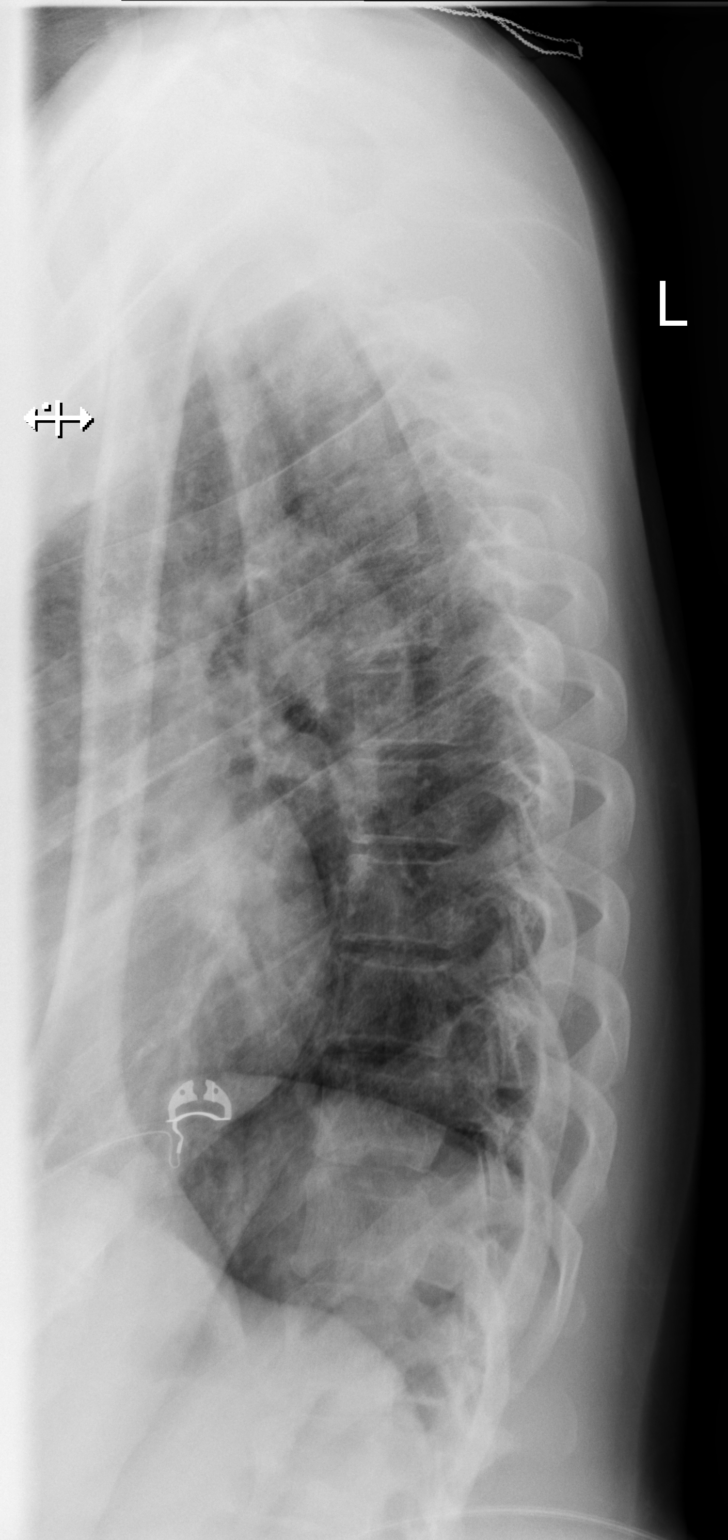

[t thoracic swimmers]
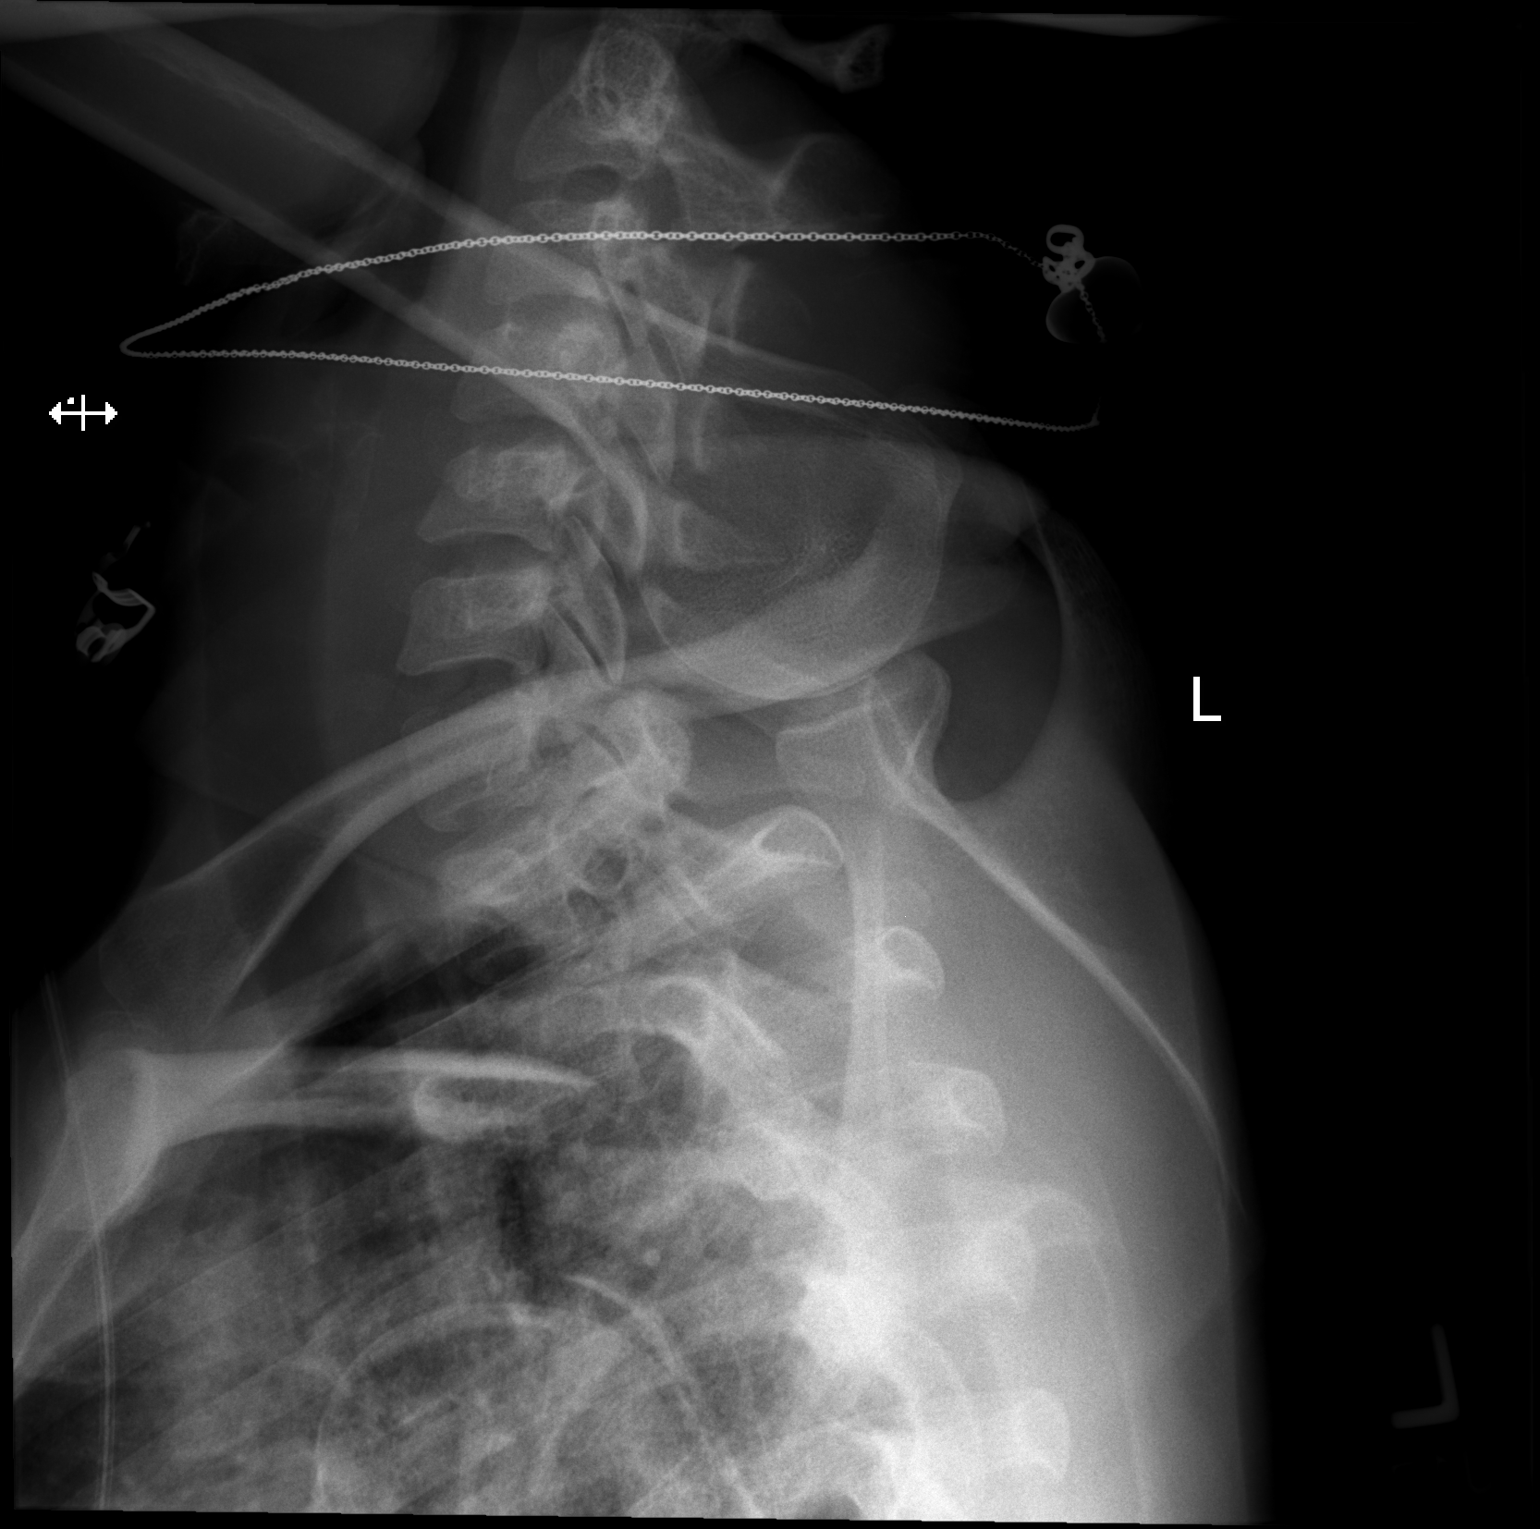

[3 of 3 positions shown; findings below may reference images not displayed]

FINDINGS: There is no evidence of thoracic spine fracture. Alignment is
normal. No other significant bone abnormalities are identified.
IMPRESSION: No acute abnormality noted.

## 2018-05-30 ENCOUNTER — Other Ambulatory Visit: Payer: Self-pay

## 2018-05-30 ENCOUNTER — Emergency Department (HOSPITAL_COMMUNITY)
Admission: EM | Admit: 2018-05-30 | Discharge: 2018-05-30 | Disposition: A | Discharge status: Home / Self Care | Payer: Self-pay | Attending: Emergency Medicine | Admitting: Emergency Medicine

## 2018-05-30 ENCOUNTER — Encounter (HOSPITAL_COMMUNITY): Payer: Self-pay | Admitting: Emergency Medicine

## 2018-05-30 DIAGNOSIS — K029 Dental caries, unspecified: Secondary | ICD-10-CM | POA: Insufficient documentation

## 2018-05-30 DIAGNOSIS — F17210 Nicotine dependence, cigarettes, uncomplicated: Secondary | ICD-10-CM | POA: Insufficient documentation

## 2018-05-30 DIAGNOSIS — K0510 Chronic gingivitis, plaque induced: Secondary | ICD-10-CM

## 2018-05-30 DIAGNOSIS — K0889 Other specified disorders of teeth and supporting structures: Secondary | ICD-10-CM | POA: Insufficient documentation

## 2018-05-30 DIAGNOSIS — Z91040 Latex allergy status: Secondary | ICD-10-CM | POA: Insufficient documentation

## 2018-05-30 DIAGNOSIS — K0500 Acute gingivitis, plaque induced: Secondary | ICD-10-CM | POA: Insufficient documentation

## 2018-05-30 MED ORDER — CHLORHEXIDINE GLUCONATE 0.12% ORAL RINSE (MEDLINE KIT): 15.0000 mL | Freq: Two times a day (BID) | OROMUCOSAL | 0 refills | Status: DC

## 2018-05-30 NOTE — Discharge Instructions (Signed)
You were evaluated today for dental pain.  Seems you did have evidence of gingivitis.  I have given you a mouthwash.  Please follow the directions on the prescription.  I would suggest naproxen or ibuprofen as needed for pain.  Please follow-up with dentistry for reevaluation.  I have given you resources in your discharge packet to follow-up with dentist.

## 2018-05-30 NOTE — ED Triage Notes (Signed)
Onset last night while eating felt pieces of a tooth and feels there is a hole where her front bottom tooth is. Pain 7/10 achy sore sharp. States has history of teeth problems due to eating ice all the time.

## 2018-05-30 NOTE — ED Provider Notes (Signed)
[facility] Provider Note   CSN: [id number] Arrival date & time: 05/30/18  7412  History   Chief Complaint Chief Complaint  Patient presents with  . Dental Problem    HPI Paige Rowe is a 43 y.o. female with medical history significant for dysmenorrhea who presents for evaluation of dental pain.  Patient states he is not followed by dentistry, however has known multiple dental caries.  Patient states that she was eating yesterday when she felt something "fall the back of my tooth" and developed pain.  Pain is located to the posterior inferior portion of her lower front tooth.  States she has pain with hot or cold intake as well as breathing and air to her teeth.  She rates his pain a 5/10.  Pain does not radiate.  She has not taken anything for her pain.  Denies fever, chills, nausea, vomiting, drooling, dysphasia, trismus, jaw claudication, neck pain, neck stiffness, facial swelling, bleeding, drainage from her mouth, abnormal palate elevation.  Denies additional aggravating or alleviating factors.   History obtained from patient.  No interpreter was used.     HPI  History reviewed. No pertinent past medical history.  Patient Active Problem List   Diagnosis Date Noted  . Menorrhagia 08/27/2011  . Dysmenorrhea 08/27/2011  . S/P myomectomy on 08/19/10 08/27/2011    Past Surgical History:  Procedure Laterality Date  . MYOMECTOMY  may 2012     OB History    Gravida  0   Para  0   Term  0   Preterm  0   AB  0   Living  0     SAB  0   TAB  0   Ectopic  0   Multiple  0   Live Births               Home Medications    Prior to Admission medications   Medication Sig Start Date End Date Taking? Authorizing Provider  azithromycin (ZITHROMAX) 500 MG tablet Take 2 tablets (1,000 mg total) by mouth once. Patient not taking: Reported on 09/13/2015 08/09/12   Janne Napoleon, NP  chlorhexidine gluconate, MEDLINE KIT, (PERIDEX)  0.12 % solution Use as directed 15 mLs in the mouth or throat 2 (two) times daily. 05/30/18   Chico Cawood A, PA-C  diazepam (VALIUM) 2 MG tablet Take 1 tablet (2 mg total) by mouth every 6 (six) hours as needed for anxiety. 09/13/15   Deno Etienne, DO  HYDROcodone-acetaminophen (NORCO/VICODIN) 5-325 MG per tablet Take 1 tablet by mouth every 4 (four) hours as needed. Patient not taking: Reported on 09/13/2015 12/28/13   Ward, Delice Bison, DO  ibuprofen (ADVIL,MOTRIN) 800 MG tablet Take 1 tablet (800 mg total) by mouth 3 (three) times daily. 10/09/16   Orpah Greek, MD  loratadine (CLARITIN) 10 MG tablet Take 10 mg by mouth daily as needed for allergies.    [provider]  megestrol (MEGACE) 40 MG tablet Take 1 tablet (40 mg total) by mouth 2 (two) times daily. Patient not taking: Reported on 09/13/2015 05/30/13   Osborne Oman, MD  methocarbamol (ROBAXIN) 500 MG tablet Take 1 tablet (500 mg total) by mouth 2 (two) times daily. Patient not taking: Reported on 09/13/2015 05/11/14   Larene Pickett, PA-C  metroNIDAZOLE (FLAGYL) 500 MG tablet Take 1 tablet (500 mg total) by mouth 2 (two) times daily. One po bid x 7 days 10/09/16   Orpah Greek, MD  norgestimate-ethinyl estradiol (SPRINTEC 28) 0.25-35 MG-MCG tablet Take 1 tablet by mouth daily. 12/13/10 12/13/11  Woodroe Mode, MD    Family History No family history on file.  Social History Social History   Tobacco Use  . Smoking status: Current Every Day Smoker    Packs/day: 0.25    Years: 10.00    Pack years: 2.50    Types: Cigarettes  . Smokeless tobacco: Never Used  Substance Use Topics  . Alcohol use: No  . Drug use: Not Currently     Allergies   Latex   Review of Systems Review of Systems  Constitutional: Negative.   HENT: Positive for dental problem. Negative for congestion, drooling, facial swelling, nosebleeds, postnasal drip, rhinorrhea, sinus pressure, sinus pain, sore throat, tinnitus, trouble swallowing  and voice change.   Eyes: Negative.   Respiratory: Negative.  Negative for wheezing and stridor.   Cardiovascular: Negative.   Gastrointestinal: Negative.   Musculoskeletal: Negative for neck pain and neck stiffness.  Skin: Negative for rash and wound.  Neurological: Negative for facial asymmetry, weakness and headaches.  All other systems reviewed and are negative.    Physical Exam Updated Vital Signs BP (!) 143/100 (BP Location: Right Arm)   Pulse (!) 102   Temp 97.9 F (36.6 C) (Oral)   Resp 18   Ht '5\' 4"'$  (1.626 m)   Wt 59 kg   SpO2 100%   BMI 22.31 kg/m   Physical Exam Vitals signs and nursing note reviewed.  Constitutional:      General: She is not in acute distress.    Appearance: She is well-developed. She is not ill-appearing, toxic-appearing or diaphoretic.     Comments: Sitting in chair texting on phone initial evaluation.  Does not appear in any acute distress.  HENT:     Head: Normocephalic and atraumatic.     Jaw: There is normal jaw occlusion. No trismus, tenderness, swelling, pain on movement or malocclusion.     Comments: No facial swelling    Right Ear: Tympanic membrane, ear canal and external ear normal.     Left Ear: Tympanic membrane, ear canal and external ear normal.     Nose: Nose normal.     Right Sinus: No maxillary sinus tenderness or frontal sinus tenderness.     Left Sinus: No maxillary sinus tenderness or frontal sinus tenderness.     Mouth/Throat:      Comments: Mucous membranes moist.  Uvula midline without deviation.  No tonsillar edema or tonsillar exudate.  No abnormal elevation of palate.  Patient with multiple dental caries as well as overall poor dentition.  Extensive tartar and plaque buildup on posterior and inferior portions of lower and upper teeth.  Gingiva without erythema or edema.  No evidence of drainable periapical abscess.  Posterior surface tooth #24 with missing piece of plaque to inferior surface.  She does have some mild  gingival irritation at tooth and gingival surface. No submandibular swelling or erythema. Eyes:     Pupils: Pupils are equal, round, and reactive to light.  Neck:     Musculoskeletal: Full passive range of motion without pain, normal range of motion and neck supple.     Trachea: Phonation normal.     Comments: No neck stiffness or neck rigidity.  No cervical lymphadenopathy. Cardiovascular:     Rate and Rhythm: Normal rate.  Pulmonary:     Effort: No respiratory distress.  Abdominal:     General: There is no distension.  Musculoskeletal:  Normal range of motion.  Skin:    General: Skin is warm and dry.  Neurological:     Mental Status: She is alert.      ED Treatments / Results  Labs (all labs ordered are listed, but only abnormal results are displayed) Labs Reviewed - No data to display  EKG None  Radiology No results found.  Procedures Procedures (including critical care time)  Medications Ordered in ED Medications - No data to display   Initial Impression / Assessment and Plan / ED Course  I have reviewed the triage vital signs and the nursing notes.  Pertinent labs & imaging results that were available during my care of the patient were reviewed by me and considered in my medical decision making (see chart for details).  43 year old female appears otherwise well presents for evaluation of dental pain.  Afebrile, nonseptic, non-ill-appearing.  Patient with extensive dental caries as well as extensive plaque buildup.  No gingival erythema or edema.  No evidence of periapical abscess.  Mucous membranes moist.  No abnormal elevation of palate.  No submandibular swelling or erythema.  No evidence of Ludwig's angina or deep space infection.  No evidence of fractured teeth. Tooth number #24 with large piece of missing plaque.  Evidence of gingivitis.  Will give patient mouthwash and have patient follow-up with dentistry.  Given resources for dentistry.  Patient  hemodynamically stable and appropriate for discharge at this time.  She is mildly hypertensive in department.  She is asymptomatic without headache, vision changes, chest pain, shortness of breath, nausea, vomiting or abdominal pain.  Discussed follow-up with PCP for reevaluation.  Discussed return precautions.  Patient voiced understanding and is agreeable for follow-up.     Final Clinical Impressions(s) / ED Diagnoses   Final diagnoses:  Pain, dental  Gingivitis    ED Discharge Orders         Ordered    chlorhexidine gluconate, MEDLINE KIT, (PERIDEX) 0.12 % solution  2 times daily     05/30/18 0812           London Nonaka A, PA-C 05/30/18 5537    Lajean Saver, MD 05/30/18 [phone]

## 2019-09-20 ENCOUNTER — Other Ambulatory Visit: Payer: Self-pay

## 2019-09-20 ENCOUNTER — Ambulatory Visit (HOSPITAL_COMMUNITY)
Admission: EM | Admit: 2019-09-20 | Discharge: 2019-09-20 | Disposition: A | Discharge status: Home / Self Care | Payer: Self-pay | Attending: Family Medicine | Admitting: Family Medicine

## 2019-09-20 ENCOUNTER — Encounter (HOSPITAL_COMMUNITY): Payer: Self-pay

## 2019-09-20 DIAGNOSIS — K0889 Other specified disorders of teeth and supporting structures: Secondary | ICD-10-CM

## 2019-09-20 MED ORDER — IBUPROFEN 800 MG PO TABS: 800.0000 mg | ORAL_TABLET | Freq: Three times a day (TID) | ORAL | 0 refills | Status: DC

## 2019-09-20 MED ORDER — PENICILLIN V POTASSIUM 500 MG PO TABS: 500.0000 mg | ORAL_TABLET | Freq: Three times a day (TID) | ORAL | 0 refills | Status: DC

## 2019-09-20 NOTE — ED Triage Notes (Signed)
Pt presents today with dental pain on right lower side. Pt states she "chipped one of her back lower teeth and it has been causing her excruciating pain". Pt states she was seen by a dentist and the tooth needs to be "cut out". Pt requesting help with symptom management until procedure can be done. Pt has tried tylenol and Advil with out relief, last dose 6/13 PM. Pt denies fever, or chills.

## 2019-09-20 NOTE — ED Provider Notes (Signed)
Paige Rowe   [id number] 09/20/19 Arrival Time: 1856  ASSESSMENT & PLAN:  1. Pain, dental      No sign of abscess requiring I&D at this time. Discussed.  Meds ordered this encounter  Medications   ibuprofen (ADVIL) 800 MG tablet    Sig: Take 1 tablet (800 mg total) by mouth 3 (three) times daily with meals.    Dispense:  30 tablet    Refill:  0   penicillin v potassium (VEETID) 500 MG tablet    Sig: Take 1 tablet (500 mg total) by mouth 3 (three) times daily.    Dispense:  30 tablet    Refill:  0    Plans dental f/u as soon as her dental insurance is valid. Work note given.,  Reviewed expectations re: course of current medical issues. Questions answered. Outlined signs and symptoms indicating need for more acute intervention. Patient verbalized understanding. After Visit Summary given.   SUBJECTIVE:  Paige Rowe is a 44 y.o. female who reports gradual onset of right lower dental pain described as aching/throbbing with temp sensitivity. Present over this past week. H/O same pain on/off over the past month or two. Fever: absent. Tolerating PO intake but reports significant pain with chewing. Normal swallowing. She does not see a dentist regularly. No neck swelling or pain. OTC analgesics without relief.    OBJECTIVE: Vitals:   09/20/19 1557  BP: 118/62  Pulse: 68  Resp: 18  Temp: 98.9 F (37.2 C)  TempSrc: Oral  SpO2: 100%    General appearance: alert; no distress HENT: normocephalic; atraumatic; dentition: fair; right lower gums without areas of fluctuance, drainage, or bleeding and with tenderness; normal jaw movement without difficulty Neck: supple without LAD; FROM; trachea midline Lungs: normal respirations; unlabored; speaks full sentences without difficulty Skin: warm and dry Psychological: alert and cooperative; normal mood and affect  Allergies  Allergen Reactions   Latex Itching    History reviewed. No pertinent past medical  history. Social History   Socioeconomic History   Marital status: Single    Spouse name: Not on file   Number of children: Not on file   Years of education: Not on file   Highest education level: Not on file  Occupational History   Not on file  Tobacco Use   Smoking status: Current Every Day Smoker    Packs/day: 0.25    Years: 10.00    Pack years: 2.50    Types: Cigarettes   Smokeless tobacco: Never Used  Substance and Sexual Activity   Alcohol use: No   Drug use: Not Currently   Sexual activity: Yes    Birth control/protection: None  Other Topics Concern   Not on file  Social History Narrative   Not on file   Social Determinants of Health   Financial Resource Strain:    Difficulty of Paying Living Expenses:   Food Insecurity:    Worried About Charity fundraiser in the Last Year:    Arboriculturist in the Last Year:   Transportation Needs:    Film/video editor (Medical):    Lack of Transportation (Non-Medical):   Physical Activity:    Days of Exercise per Week:    Minutes of Exercise per Session:   Stress:    Feeling of Stress :   Social Connections:    Frequency of Communication with Friends and Family:    Frequency of Social Gatherings with Friends and Family:    Attends Religious Services:  Active Member of Clubs or Organizations:    Attends Music therapist:    Marital Status:   Intimate Partner Violence:    Fear of Current or Ex-Partner:    Emotionally Abused:    Physically Abused:    Sexually Abused:    History reviewed. No pertinent family history. Past Surgical History:  Procedure Laterality Date   MYOMECTOMY  may 2012     Vanessa Kick, MD 09/20/19 1753

## 2023-03-08 ENCOUNTER — Ambulatory Visit (HOSPITAL_COMMUNITY)
Admission: EM | Admit: 2023-03-08 | Discharge: 2023-03-08 | Disposition: A | Discharge status: Home / Self Care | Payer: Self-pay | Attending: Internal Medicine | Admitting: Internal Medicine

## 2023-03-08 ENCOUNTER — Encounter (HOSPITAL_COMMUNITY): Payer: Self-pay | Admitting: Emergency Medicine

## 2023-03-08 DIAGNOSIS — J0100 Acute maxillary sinusitis, unspecified: Secondary | ICD-10-CM

## 2023-03-08 MED ORDER — AZITHROMYCIN 250 MG PO TABS: ORAL_TABLET | ORAL | 0 refills | Status: DC

## 2023-03-08 MED ORDER — PREDNISONE 10 MG PO TABS: 40.0000 mg | ORAL_TABLET | Freq: Every day | ORAL | 0 refills | Status: AC

## 2023-03-08 NOTE — Discharge Instructions (Addendum)
Symptoms most consistent with a Sinus infection and upper respiratory infection. Will treat with the following:  Azithromycin 250mg  take 2 tablets, then 1 tablet daily for 4 days Prednisone (steroid) 40mg  daily in the morning for 5 days Tylenol for fevers but avoid ibuprofen while on the steroid Rest and hydrate  Return to urgent care or PCP if symptoms worsen or fail to resolve.

## 2023-03-08 NOTE — ED Provider Notes (Signed)
[facility]    CSN: [id number] Arrival date & time: 03/08/23  1714      History   Chief Complaint Chief Complaint  Patient presents with   Sore Throat   Generalized Body Aches    HPI Paige Rowe is a 47 y.o. female.   47 year old female who presents to urgent care with complaints of sore throat, congestion, mucus production and generalized fatigue with muscle aches.  This started Wednesday at work when she noticed while talking on the phone that she had a sore throat and that it was somewhat difficult to talk.  She eventually went home and reports that since then the symptoms have worsened.  She has had pretty severe fatigue requiring her to take several days off from work and sleep.  She denies fevers or chills.  She denies ear pain, shortness of breath.  She has not had a cough except when she has mucus in the back of her throat.  She denies any sick contacts.  She does feel that she is getting better today   Sore Throat Pertinent negatives include no chest pain, no abdominal pain and no shortness of breath.    History reviewed. No pertinent past medical history.  Patient Active Problem List   Diagnosis Date Noted   Menorrhagia 08/27/2011   Dysmenorrhea 08/27/2011   S/P myomectomy on 08/19/10 08/27/2011    Past Surgical History:  Procedure Laterality Date   MYOMECTOMY  may 2012    OB History     Gravida  0   Para  0   Term  0   Preterm  0   AB  0   Living  0      SAB  0   IAB  0   Ectopic  0   Multiple  0   Live Births               Home Medications    Prior to Admission medications   Medication Sig Start Date End Date Taking? Authorizing Provider  ibuprofen (ADVIL) 800 MG tablet Take 1 tablet (800 mg total) by mouth 3 (three) times daily with meals. 09/20/19   Mardella Layman, MD  penicillin v potassium (VEETID) 500 MG tablet Take 1 tablet (500 mg total) by mouth 3 (three) times daily. Patient not taking: Reported on  03/08/2023 09/20/19   Mardella Layman, MD  loratadine (CLARITIN) 10 MG tablet Take 10 mg by mouth daily as needed for allergies.  09/20/19  [provider]  norgestimate-ethinyl estradiol (SPRINTEC 28) 0.25-35 MG-MCG tablet Take 1 tablet by mouth daily. 12/13/10 09/20/19  Adam Phenix, MD    Family History History reviewed. No pertinent family history.  Social History Social History   Tobacco Use   Smoking status: Every Day    Current packs/day: 0.25    Average packs/day: 0.3 packs/day for 10.0 years (2.5 ttl pk-yrs)    Types: Cigarettes   Smokeless tobacco: Never  Substance Use Topics   Alcohol use: No   Drug use: Not Currently     Allergies   Latex   Review of Systems Review of Systems  Constitutional:  Positive for fatigue. Negative for chills and fever.  HENT:  Positive for congestion, sore throat and trouble swallowing. Negative for ear pain.   Eyes:  Negative for pain and visual disturbance.  Respiratory:  Negative for cough and shortness of breath.   Cardiovascular:  Negative for chest pain and palpitations.  Gastrointestinal:  Negative for abdominal pain,  diarrhea, nausea and vomiting.  Genitourinary:  Negative for dysuria and hematuria.  Musculoskeletal:  Negative for arthralgias and back pain.       Generalized body aches  Skin:  Negative for color change and rash.  Neurological:  Negative for seizures and syncope.  All other systems reviewed and are negative.    Physical Exam Triage Vital Signs ED Triage Vitals  Encounter Vitals Group     BP 03/08/23 1832 (!) 151/92     Systolic BP Percentile --      Diastolic BP Percentile --      Pulse Rate 03/08/23 1832 68     Resp 03/08/23 1832 17     Temp 03/08/23 1832 98.8 F (37.1 C)     Temp Source 03/08/23 1832 Oral     SpO2 03/08/23 1832 98 %     Weight --      Height --      Head Circumference --      Peak Flow --      Pain Score 03/08/23 1835 9     Pain Loc --      Pain Education --       Exclude from Growth Chart --    No data found.  Updated Vital Signs BP (!) 151/92 (BP Location: Right Arm)   Pulse 68   Temp 98.8 F (37.1 C) (Oral)   Resp 17   LMP 03/06/2023 (Approximate)   SpO2 98%   Visual Acuity Right Eye Distance:   Left Eye Distance:   Bilateral Distance:    Right Eye Near:   Left Eye Near:    Bilateral Near:     Physical Exam Vitals and nursing note reviewed.  Constitutional:      General: She is not in acute distress.    Appearance: She is well-developed.  HENT:     Head: Normocephalic and atraumatic.     Right Ear: Tympanic membrane normal.     Left Ear: Tympanic membrane normal.     Nose: Congestion present.     Mouth/Throat:     Mouth: Mucous membranes are moist.     Pharynx: Posterior oropharyngeal erythema (Very mild) present.  Eyes:     Conjunctiva/sclera: Conjunctivae normal.  Cardiovascular:     Rate and Rhythm: Normal rate and regular rhythm.     Heart sounds: No murmur heard. Pulmonary:     Effort: Pulmonary effort is normal. No respiratory distress.     Breath sounds: Normal breath sounds.  Abdominal:     Palpations: Abdomen is soft.     Tenderness: There is no abdominal tenderness.  Musculoskeletal:        General: No swelling.     Cervical back: Neck supple.  Skin:    General: Skin is warm and dry.     Capillary Refill: Capillary refill takes less than 2 seconds.  Neurological:     Mental Status: She is alert.  Psychiatric:        Mood and Affect: Mood normal.      UC Treatments / Results  Labs (all labs ordered are listed, but only abnormal results are displayed) Labs Reviewed - No data to display  EKG   Radiology No results found.  Procedures Procedures (including critical care time)  Medications Ordered in UC Medications - No data to display  Initial Impression / Assessment and Plan / UC Course  I have reviewed the triage vital signs and the nursing notes.  Pertinent labs & imaging results  that  were available during my care of the patient were reviewed by me and considered in my medical decision making (see chart for details).     Acute non-recurrent maxillary sinusitis   Symptoms most consistent with a Sinus infection and upper respiratory infection. Will treat with the following:  Azithromycin 250mg  take 2 tablets, then 1 tablet daily for 4 days Prednisone (steroid) 40mg  daily in the morning for 5 days Tylenol for fevers but avoid ibuprofen while on the steroid Rest and hydrate  Return to urgent care or PCP if symptoms worsen or fail to resolve.   Final Clinical Impressions(s) / UC Diagnoses   Final diagnoses:  None   Discharge Instructions   None    ED Prescriptions   None    PDMP not reviewed this encounter.   Quintella Reichert 03/08/23 1904

## 2023-03-08 NOTE — ED Triage Notes (Signed)
Pt c/o body aches, sore throat, joint pain,congestion since wednesday

## 2023-03-28 ENCOUNTER — Encounter (HOSPITAL_COMMUNITY): Payer: Self-pay | Admitting: *Deleted

## 2023-03-28 ENCOUNTER — Ambulatory Visit (HOSPITAL_COMMUNITY)
Admission: EM | Admit: 2023-03-28 | Discharge: 2023-03-28 | Disposition: A | Discharge status: Home / Self Care | Payer: Self-pay

## 2023-03-28 DIAGNOSIS — R600 Localized edema: Secondary | ICD-10-CM

## 2023-03-28 NOTE — ED Triage Notes (Signed)
C/O swelling to BLE over past couple weeks - states usually swells and lasts throughout the entire day when it starts.  Pt reports finishing steroid course and abx recently for URI and sinusitis. States she feels legs started swelling when she started meds.

## 2023-03-28 NOTE — ED Provider Notes (Signed)
[facility]    CSN: [id number] Arrival date & time: 03/28/23  1435      History   Chief Complaint Chief Complaint  Patient presents with   Leg Swelling    HPI Paige Rowe is a 47 y.o. female.   47 year old female who presents to urgent care with complaints of bilateral lower extremity edema that is below the knees.  She reports that this started after she finished her course of steroids and antibiotics for recent upper respiratory infection.  She reports that the swelling is gone in the mornings and worsens throughout the day.  She denies any pain but the swelling does cause pressure.  She denies any shortness of breath or chest pain.  She does sit at a desk all day at work with her legs down.     History reviewed. No pertinent past medical history.  Patient Active Problem List   Diagnosis Date Noted   Menorrhagia 08/27/2011   Dysmenorrhea 08/27/2011   S/P myomectomy on 08/19/10 08/27/2011    Past Surgical History:  Procedure Laterality Date   MYOMECTOMY  may 2012    OB History     Gravida  0   Para  0   Term  0   Preterm  0   AB  0   Living  0      SAB  0   IAB  0   Ectopic  0   Multiple  0   Live Births               Home Medications    Prior to Admission medications   Medication Sig Start Date End Date Taking? Authorizing Provider  azithromycin (ZITHROMAX) 250 MG tablet Take first 2 tablets together, then 1 every day until finished. 03/08/23   Maitland Lesiak A, PA-C  ibuprofen (ADVIL) 800 MG tablet Take 1 tablet (800 mg total) by mouth 3 (three) times daily with meals. 09/20/19   Mardella Layman, MD  penicillin v potassium (VEETID) 500 MG tablet Take 1 tablet (500 mg total) by mouth 3 (three) times daily. Patient not taking: Reported on 03/08/2023 09/20/19   Mardella Layman, MD  loratadine (CLARITIN) 10 MG tablet Take 10 mg by mouth daily as needed for allergies.  09/20/19  [provider]  norgestimate-ethinyl  estradiol (SPRINTEC 28) 0.25-35 MG-MCG tablet Take 1 tablet by mouth daily. 12/13/10 09/20/19  Adam Phenix, MD    Family History History reviewed. No pertinent family history.  Social History Social History   Tobacco Use   Smoking status: Every Day    Current packs/day: 0.25    Average packs/day: 0.3 packs/day for 10.0 years (2.5 ttl pk-yrs)    Types: Cigarettes   Smokeless tobacco: Never  Vaping Use   Vaping status: Never Used  Substance Use Topics   Alcohol use: Yes    Comment: occasionally   Drug use: Yes    Types: Marijuana     Allergies   Latex   Review of Systems Review of Systems  Constitutional:  Negative for chills and fever.  HENT:  Negative for ear pain and sore throat.   Eyes:  Negative for pain and visual disturbance.  Respiratory:  Negative for cough and shortness of breath.   Cardiovascular:  Negative for chest pain and palpitations.  Gastrointestinal:  Negative for abdominal pain and vomiting.  Genitourinary:  Negative for dysuria and hematuria.  Musculoskeletal:  Negative for arthralgias and back pain.       Lower  extremity swelling bilateral  Skin:  Negative for color change and rash.  Neurological:  Negative for seizures and syncope.  All other systems reviewed and are negative.    Physical Exam Triage Vital Signs ED Triage Vitals [03/28/23 1449]  Encounter Vitals Group     BP 129/81     Systolic BP Percentile      Diastolic BP Percentile      Pulse Rate 70     Resp 16     Temp 98 F (36.7 C)     Temp Source Oral     SpO2 98 %     Weight      Height      Head Circumference      Peak Flow      Pain Score 0     Pain Loc      Pain Education      Exclude from Growth Chart    No data found.  Updated Vital Signs BP 129/81   Pulse 70   Temp 98 F (36.7 C) (Oral)   Resp 16   LMP 03/06/2023 (Approximate)   SpO2 98%   Visual Acuity Right Eye Distance:   Left Eye Distance:   Bilateral Distance:    Right Eye Near:   Left Eye  Near:    Bilateral Near:     Physical Exam Vitals and nursing note reviewed.  Constitutional:      General: She is not in acute distress.    Appearance: She is well-developed.  HENT:     Head: Normocephalic and atraumatic.  Eyes:     Conjunctiva/sclera: Conjunctivae normal.  Cardiovascular:     Rate and Rhythm: Normal rate and regular rhythm.     Heart sounds: No murmur heard. Pulmonary:     Effort: Pulmonary effort is normal. No respiratory distress.     Breath sounds: Normal breath sounds.  Abdominal:     Palpations: Abdomen is soft.     Tenderness: There is no abdominal tenderness.  Musculoskeletal:        General: No swelling.     Cervical back: Neck supple.     Comments: Minimal lower extremity edema without any pitting bilateral  Skin:    General: Skin is warm and dry.     Capillary Refill: Capillary refill takes less than 2 seconds.  Neurological:     Mental Status: She is alert.  Psychiatric:        Mood and Affect: Mood normal.      UC Treatments / Results  Labs (all labs ordered are listed, but only abnormal results are displayed) Labs Reviewed - No data to display  EKG   Radiology No results found.  Procedures Procedures (including critical care time)  Medications Ordered in UC Medications - No data to display  Initial Impression / Assessment and Plan / UC Course  I have reviewed the triage vital signs and the nursing notes.  Pertinent labs & imaging results that were available during my care of the patient were reviewed by me and considered in my medical decision making (see chart for details).     Leg edema   The lower extremity edema is most likely associated with recent steroid use.  Very low concern for deep vein thrombosis as the swelling is in both lower extremities and is mild.  Also the swelling builds up during the day and goes away at night.  At this point recommend that the patient elevate her legs during the day  or use compression  stockings to help with the swelling.  She should keep a low-sodium diet and slightly increase her intake of potassium rich foods.  Reassured her that the swelling will go away but may take 2 or 3 weeks to completely resolve.  She can follow-up at urgent care as needed. Final Clinical Impressions(s) / UC Diagnoses   Final diagnoses:  Leg edema   Discharge Instructions   None    ED Prescriptions   None    PDMP not reviewed this encounter.   Landis Martins, [place] 03/28/23 [phone]

## 2023-03-28 NOTE — Discharge Instructions (Addendum)
Lower extremity swelling that is most likely caused by the recent steroid course. Very unlikely to be a blood clot as this is in both legs and the blood flow into the leg is good. Now that you are off the steroids, the swelling should resolve on its on but may take 2-3 weeks. Try to keep a low sodium diet and slightly increase potassium rich foods. Elevate legs during the day to help with swelling and can consider wearing compression stockings. Follow up as needed.

## 2023-09-09 ENCOUNTER — Emergency Department (HOSPITAL_COMMUNITY): Payer: Self-pay

## 2023-09-09 ENCOUNTER — Other Ambulatory Visit: Payer: Self-pay

## 2023-09-09 ENCOUNTER — Emergency Department (HOSPITAL_COMMUNITY)
Admission: EM | Admit: 2023-09-09 | Discharge: 2023-09-09 | Disposition: A | Discharge status: Home / Self Care | Payer: Self-pay | Attending: Student | Admitting: Student

## 2023-09-09 DIAGNOSIS — Z91040 Latex allergy status: Secondary | ICD-10-CM | POA: Diagnosis not present

## 2023-09-09 DIAGNOSIS — D219 Benign neoplasm of connective and other soft tissue, unspecified: Secondary | ICD-10-CM | POA: Diagnosis not present

## 2023-09-09 DIAGNOSIS — B3731 Acute candidiasis of vulva and vagina: Secondary | ICD-10-CM | POA: Insufficient documentation

## 2023-09-09 DIAGNOSIS — L292 Pruritus vulvae: Secondary | ICD-10-CM | POA: Diagnosis present

## 2023-09-09 LAB — WET PREP, GENITAL
Clue Cells Wet Prep HPF POC: NONE SEEN
Sperm: NONE SEEN
Trich, Wet Prep: NONE SEEN
WBC, Wet Prep HPF POC: >=10 — AB (ref <10)

## 2023-09-09 LAB — URINALYSIS, MICROSCOPIC (REFLEX)

## 2023-09-09 LAB — URINALYSIS, ROUTINE W REFLEX MICROSCOPIC
Bilirubin Urine: NEGATIVE
Glucose, UA: NEGATIVE mg/dL
Hgb urine dipstick: NEGATIVE
Ketones, ur: NEGATIVE mg/dL
Nitrite: NEGATIVE
Protein, ur: NEGATIVE mg/dL
Specific Gravity, Urine: 1.025 (ref 1.005–1.030)
pH: 6.5 (ref 5.0–8.0)

## 2023-09-09 MED ORDER — FLUCONAZOLE 150 MG PO TABS: ORAL_TABLET | ORAL | 1 refills | Status: DC

## 2023-09-09 NOTE — ED Triage Notes (Signed)
 Pt complains of vaginal itching x 2 days. Pt denies any odor, denies discharge, denies pain. Pt states she shaved and unsure if she cut herself or just irritated from shaving but the itching has gotten worse.

## 2023-09-09 NOTE — ED Provider Notes (Signed)
 [place] EMERGENCY DEPARTMENT AT [facility] Provider Note   CSN: [id number] Arrival date & time: 09/09/23  1524     History  Chief Complaint  Patient presents with   Vaginal Itching    Paige Rowe is a 48 y.o. female.  Patient complains of vaginal irritation.  Patient reports that she is sore and irritated.  Patient reports that she did shave the area.  Patient has had a white discharge.  Patient also complains of a lump on her right side.  Patient does not feel like she has any STD risk but would like to be evaluated.  Patient denies any fever or chills she has not had any cough or congestion.  Patient denies any abdominal pain.  The history is provided by the patient. No language interpreter was used.  Vaginal Itching       Home Medications Prior to Admission medications   Medication Sig Start Date End Date Taking? Authorizing Provider  fluconazole (DIFLUCAN) 150 MG tablet One tablet now.  Repeat in 3 days if still having symptoms 09/09/23  Yes Chinmay Squier K, PA-C  azithromycin  (ZITHROMAX ) 250 MG tablet Take first 2 tablets together, then 1 every day until finished. 03/08/23   White, Elizabeth A, PA-C  ibuprofen  (ADVIL ) 800 MG tablet Take 1 tablet (800 mg total) by mouth 3 (three) times daily with meals. 09/20/19   Afton Albright, MD  penicillin  v potassium (VEETID) 500 MG tablet Take 1 tablet (500 mg total) by mouth 3 (three) times daily. Patient not taking: Reported on 03/08/2023 09/20/19   Afton Albright, MD  loratadine (CLARITIN) 10 MG tablet Take 10 mg by mouth daily as needed for allergies.  09/20/19  [provider]  norgestimate -ethinyl estradiol  (SPRINTEC 28) 0.25-35 MG-MCG tablet Take 1 tablet by mouth daily. 12/13/10 09/20/19  Tresia Fruit, MD      Allergies    Latex    Review of Systems   Review of Systems  Genitourinary:  Positive for vaginal discharge.  All other systems reviewed and are negative.   Physical Exam Updated Vital Signs BP  (!) 129/91   Pulse 75   Temp 98.5 F (36.9 C) (Oral)   Resp 18   Wt 59 kg   LMP 08/30/2023 (Approximate)   SpO2 100%   BMI 22.33 kg/m  Physical Exam Vitals and nursing note reviewed.  Constitutional:      Appearance: She is well-developed.  HENT:     Head: Normocephalic.     Mouth/Throat:     Mouth: Mucous membranes are moist.  Cardiovascular:     Rate and Rhythm: Normal rate.  Pulmonary:     Effort: Pulmonary effort is normal.  Abdominal:     General: There is no distension.     Comments: Tender right groin area  Genitourinary:    Vagina: Vaginal discharge present.     Comments: Thick white vaginal discharge, no lesions, no rash, Musculoskeletal:        General: Normal range of motion.     Cervical back: Normal range of motion.  Skin:    General: Skin is warm.  Neurological:     General: No focal deficit present.     Mental Status: She is alert and oriented to person, place, and time.     ED Results / Procedures / Treatments   Labs (all labs ordered are listed, but only abnormal results are displayed) Labs Reviewed  WET PREP, GENITAL - Abnormal; Notable for the following components:  Result Value   Yeast Wet Prep HPF POC PRESENT (*)    WBC, Wet Prep HPF POC >=10 (*)    All other components within normal limits  URINALYSIS, ROUTINE W REFLEX MICROSCOPIC - Abnormal; Notable for the following components:   APPearance CLOUDY (*)    Leukocytes,Ua SMALL (*)    All other components within normal limits  URINALYSIS, MICROSCOPIC (REFLEX) - Abnormal; Notable for the following components:   Bacteria, UA RARE (*)    All other components within normal limits  GC/CHLAMYDIA PROBE AMP ([place]) NOT AT [facility]    EKG None  Radiology US  PELVIC COMPLETE W TRANSVAGINAL AND TORSION R/O Result Date: 09/09/2023 CLINICAL DATA:  Pain. EXAM: TRANSABDOMINAL AND TRANSVAGINAL ULTRASOUND OF PELVIS DOPPLER ULTRASOUND OF OVARIES TECHNIQUE: Both transabdominal and transvaginal  ultrasound examinations of the pelvis were performed. Transabdominal technique was performed for global imaging of the pelvis including uterus, ovaries, adnexal regions, and pelvic cul-de-sac. Color and duplex Doppler ultrasound was utilized to evaluate blood flow to the ovaries. Both transabdominal and transvaginal ultrasound examinations of the pelvis were performed. Transabdominal technique was performed for global imaging of the pelvis including uterus, ovaries, adnexal regions, and pelvic cul-de-sac. It was necessary to proceed with endovaginal exam following the transabdominal exam to visualize the ovaries and endometrium. COMPARISON:  pelvic ultrasound 08/28/2011. FINDINGS: Uterus Measurements: 13.1 x 7.8 x 8.8 cm = volume: 469 mL. Anterior intramural fibroid is present measuring 6.3 x 5.3 x 4.5 cm. Posterior intramural fibroid is present measuring 5.7 x 5.0 x 5.9 cm. Endometrium Thickness: 6.9 mm.  No focal abnormality visualized. Right ovary Measurements: 2.7 x 1.2 x 2.5 cm = volume: 4.3 mL. Normal appearance/no adnexal mass. Left ovary Measurements: 3.2 x 1.5 x 1.7 cm = volume: 4.3 mL. Normal appearance/no adnexal mass. Pulsed Doppler evaluation of both ovaries demonstrates normal low-resistance arterial and venous waveforms. Other findings No abnormal free fluid. IMPRESSION: 1. Enlarged uterus with 2 fibroids measuring up to 6.3 cm. 2. No evidence of ovarian torsion. Electronically Signed   By: Tyron Gallon M.D.   On: 09/09/2023 21:12    Procedures Procedures    Medications Ordered in ED Medications - No data to display  ED Course/ Medical Decision Making/ A&P                                 Medical Decision Making Patient complains of a vaginal discharge and irritation.  Amount and/or Complexity of Data Reviewed Labs: ordered. Decision-making details documented in ED Course.    Details: Labs ordered reviewed and interpreted wet prep shows yeast. Radiology: ordered and independent  interpretation performed. Decision-making details documented in ED Course.    Details: Pelvic ultrasound shows 2 uterine fibroids  Risk Prescription drug management. Risk Details: I discussed the results with patient she is advised to follow-up with her gynecologist Dr. Thurmon [place].  Patient is given a prescription for Diflucan for yeast vaginitis.  She is advised to return to the emergency department if any problems.           Final Clinical Impression(s) / ED Diagnoses Final diagnoses:  Fibroid  Yeast vaginitis    Rx / DC Orders ED Discharge Orders          Ordered    fluconazole (DIFLUCAN) 150 MG tablet        09/09/23 2137          An After Visit Summary was printed  and given to the patient.     Cayman Brogden K, PA-C 09/09/23 2144    Karlyn Overman, MD 09/09/23 [phone]

## 2023-09-09 NOTE — Discharge Instructions (Addendum)
 See your gynecologist for recheck

## 2023-09-10 LAB — GC/CHLAMYDIA PROBE AMP (~~LOC~~) NOT AT ARMC
Chlamydia: NEGATIVE
Comment: NEGATIVE
Comment: NORMAL
Neisseria Gonorrhea: NEGATIVE

## 2023-12-03 ENCOUNTER — Encounter: Payer: Self-pay | Admitting: Radiology

## 2023-12-03 ENCOUNTER — Ambulatory Visit: Payer: Self-pay | Admitting: Radiology

## 2023-12-03 VITALS — BP 124/72 | HR 111 | Ht 64.25 in | Wt 133.0 lb

## 2023-12-03 DIAGNOSIS — D219 Benign neoplasm of connective and other soft tissue, unspecified: Secondary | ICD-10-CM

## 2023-12-03 DIAGNOSIS — N920 Excessive and frequent menstruation with regular cycle: Secondary | ICD-10-CM

## 2023-12-03 NOTE — Progress Notes (Signed)
 Paige Rowe 02/04/1976 [id number]   History:  48 y.o. G0 Referred from [facility] for uterine fibroids, here to discuss hysterectomy (pelvic ultrasound 09/09/23). Complains of heavy, irregular periods, decreased energy, abdominal bloating. Symptoms increased over the last 5 years. Hx Myomectomy 2012, only had relief for about 1 year after surgery.   Gynecologic History Patient's last menstrual period was 11/12/2023 (exact date). Period Duration (Days): 14 Period Pattern: (!) Irregular Menstrual Flow: Heavy (passes clots) Menstrual Control: Maxi pad Dysmenorrhea: (!) Severe Dysmenorrhea Symptoms: Cramping Contraception/Family planning: condoms Sexually active: yes Last Pap: ?2020 per pt  Obstetric History OB History  Gravida Para Term Preterm AB Living  0 0 0 0 0 0  SAB IAB Ectopic Multiple Live Births  0 0 0 0       Narrative & Impression  CLINICAL DATA:  Pain.   EXAM: TRANSABDOMINAL AND TRANSVAGINAL ULTRASOUND OF PELVIS   DOPPLER ULTRASOUND OF OVARIES   TECHNIQUE: Both transabdominal and transvaginal ultrasound examinations of the pelvis were performed. Transabdominal technique was performed for global imaging of the pelvis including uterus, ovaries, adnexal regions, and pelvic cul-de-sac.   Color and duplex Doppler ultrasound was utilized to evaluate blood flow to the ovaries.   Both transabdominal and transvaginal ultrasound examinations of the pelvis were performed. Transabdominal technique was performed for global imaging of the pelvis including uterus, ovaries, adnexal regions, and pelvic cul-de-sac. It was necessary to proceed with endovaginal exam following the transabdominal exam to visualize the ovaries and endometrium.   COMPARISON:  pelvic ultrasound 08/28/2011.   FINDINGS: Uterus   Measurements: 13.1 x 7.8 x 8.8 cm = volume: 469 mL. Anterior intramural fibroid is present measuring 6.3 x 5.3 x 4.5 cm. Posterior intramural fibroid is present  measuring 5.7 x 5.0 x 5.9 cm.   Endometrium   Thickness: 6.9 mm.  No focal abnormality visualized.   Right ovary   Measurements: 2.7 x 1.2 x 2.5 cm = volume: 4.3 mL. Normal appearance/no adnexal mass.   Left ovary   Measurements: 3.2 x 1.5 x 1.7 cm = volume: 4.3 mL. Normal appearance/no adnexal mass.   Pulsed Doppler evaluation of both ovaries demonstrates normal low-resistance arterial and venous waveforms.   Other findings   No abnormal free fluid.   IMPRESSION: 1. Enlarged uterus with 2 fibroids measuring up to 6.3 cm. 2. No evidence of ovarian torsion.     Electronically Signed   By: Greig Pique M.D.   On: 09/09/2023 21:12    The following portions of the patient's history were reviewed and updated as appropriate: allergies, current medications, past family history, past medical history, past social history, past surgical history, and problem list.  Review of Systems  All other systems reviewed and are negative.   Past medical history, past surgical history, family history and social history were all reviewed and documented in the EPIC chart.  Exam:  Vitals:   12/03/23 0926  BP: 124/72  Pulse: (!) 111  SpO2: 96%  Weight: 133 lb (60.3 kg)  Height: 5' 4.25 (1.632 m)   Body mass index is 22.65 kg/m.  Physical Exam Constitutional:      Appearance: Normal appearance. She is normal weight.  Abdominal:     General: Bowel sounds are normal.     Palpations: There is mass (enlarged uterus appreciated).  Genitourinary:    General: Normal vulva.     Vagina: Normal.     Uterus: Enlarged.   Neurological:     Mental Status: She is alert.  Darice Hoit, CMA present for exam  Assessment/Plan:   1. Menorrhagia with regular cycle (Primary) - CBC - Iron, TIBC and Ferritin Panel  2. Fibroid Desires definitive management of fibroids, bleeding and pain. Interested in hysterectomy. Will schedule consult with surgeon. Declines medication options at this  time. Declines full AEX today.     Return for hyst consult with surgeon fibroids.  GINETTE COZIER B WHNP-BC 10:11 AM 12/03/2023

## 2023-12-04 ENCOUNTER — Ambulatory Visit: Payer: Self-pay | Admitting: Radiology

## 2023-12-04 DIAGNOSIS — R790 Abnormal level of blood mineral: Secondary | ICD-10-CM

## 2023-12-04 DIAGNOSIS — D509 Iron deficiency anemia, unspecified: Secondary | ICD-10-CM

## 2023-12-04 LAB — IRON,TIBC AND FERRITIN PANEL
%SAT: 3 % — ABNORMAL LOW (ref 16–45)
Ferritin: 4 ng/mL — ABNORMAL LOW (ref 16–232)
Iron: 16 ng/mL — ABNORMAL LOW (ref 40–232)
TIBC: 465 ug/dL — ABNORMAL HIGH (ref 250–450)

## 2023-12-04 LAB — CBC
HCT: 29.4 % — ABNORMAL LOW (ref 35.0–45.0)
Hemoglobin: 7.9 g/dL — ABNORMAL LOW (ref 11.7–15.5)
MCH: 18.3 pg — ABNORMAL LOW (ref 27.0–33.0)
MCHC: 26.9 g/dL — ABNORMAL LOW (ref 32.0–36.0)
MCV: 68.1 fL — ABNORMAL LOW (ref 80.0–100.0)
MPV: 10.9 fL (ref 7.5–12.5)
Platelets: 336 Thousand/uL (ref 140–400)
RBC: 4.32 Million/uL (ref 3.80–5.10)
RDW: 18.2 % — ABNORMAL HIGH (ref 11.0–15.0)
WBC: 9.5 Thousand/uL (ref 3.8–10.8)

## 2023-12-04 MED ORDER — ACCRUFER 30 MG PO CAPS: 30.0000 mg | ORAL_CAPSULE | Freq: Two times a day (BID) | ORAL | 2 refills | Status: AC

## 2023-12-29 NOTE — Progress Notes (Signed)
 48 y.o. G0P0000 female with AUB-F (Prior abdominal myomectomy May 2012, note in [facility]) here for surgical consultation for AUB-F. Single.  Patient's last menstrual period was 12/13/2023 (exact date). Period Duration (Days): 8-10 Period Pattern: Regular Menstrual Flow: Heavy Menstrual Control: Maxi pad Dysmenorrhea: (!) Severe Dysmenorrhea Symptoms: Nausea, Diarrhea, Headache  She is currently self pay as she is in between jobs. Starting new job Jan 12 2024. Most recent Hgb 7.9. She has not started PO iron and has been unable to afford medications fro bleeding. No recent GYN care, only ED visits as needed. Declines future child bearing +Bulk symptoms (early satiety) and dysmenorrhea  Last PAP: 12/13/2010  09/09/23 TVUS: Uterus   Measurements: 13.1 x 7.8 x 8.8 cm = volume: 469 mL. Anterior intramural fibroid is present measuring 6.3 x 5.3 x 4.5 cm. Posterior intramural fibroid is present measuring 5.7 x 5.0 x 5.9 cm.   Endometrium   Thickness: 6.9 mm.  No focal abnormality visualized.   Right ovary   Measurements: 2.7 x 1.2 x 2.5 cm = volume: 4.3 mL. Normal appearance/no adnexal mass.   Left ovary   Measurements: 3.2 x 1.5 x 1.7 cm = volume: 4.3 mL. Normal appearance/no adnexal mass.   Pulsed Doppler evaluation of both ovaries demonstrates normal low-resistance arterial and venous waveforms.   Other findings   No abnormal free fluid.   IMPRESSION: 1. Enlarged uterus with 2 fibroids measuring up to 6.3 cm. 2. No evidence of ovarian torsion.  Birth control: none  GYN HISTORY: Prior abdominal myomectomy  OB History  Gravida Para Term Preterm AB Living  0 0 0 0 0 0  SAB IAB Ectopic Multiple Live Births  0 0 0 0     Past Medical History:  Diagnosis Date   Anemia    Fibroids     Past Surgical History:  Procedure Laterality Date   MYOMECTOMY  may 2012    Current Outpatient Medications on File Prior to Visit  Medication Sig Dispense Refill    Aspirin-Salicylamide-Caffeine (ARTHRITIS STRENGTH BC POWDER PO) Take by mouth.     Calcium Carbonate Antacid (TUMS PO) Take by mouth.     Ferric Maltol  (ACCRUFER ) 30 MG CAPS Take 1 capsule (30 mg total) by mouth 2 (two) times daily. (Patient not taking: Reported on 12/30/2023) 60 capsule 2   [DISCONTINUED] loratadine (CLARITIN) 10 MG tablet Take 10 mg by mouth daily as needed for allergies.     [DISCONTINUED] norgestimate -ethinyl estradiol  (SPRINTEC 28) 0.25-35 MG-MCG tablet Take 1 tablet by mouth daily. 1 Package 11   No current facility-administered medications on file prior to visit.    Allergies  Allergen Reactions   Latex Itching      PE Today's Vitals   12/30/23 1157  BP: 100/62  Pulse: 84  Temp: 98 F (36.7 C)  TempSrc: Oral  SpO2: 98%  Weight: 132 lb (59.9 kg)  Height: 5' 4.5 (1.638 m)   Body mass index is 22.31 kg/m.  Physical Exam Vitals reviewed.  Constitutional:      General: She is not in acute distress.    Appearance: Normal appearance.  HENT:     Head: Normocephalic and atraumatic.     Nose: Nose normal.  Eyes:     Extraocular Movements: Extraocular movements intact.     Conjunctiva/sclera: Conjunctivae normal.  Pulmonary:     Effort: Pulmonary effort is normal.  Abdominal:     Comments: Well healed Pfannentiel  Musculoskeletal:        General:  Normal range of motion.     Cervical back: Normal range of motion.  Neurological:     General: No focal deficit present.     Mental Status: She is alert.  Psychiatric:        Mood and Affect: Mood normal.        Behavior: Behavior normal.       Assessment and Plan:        Menorrhagia with regular cycle Assessment & Plan: Likely AUB-F, known fibroids with history of prior myomectomy Fibroids now measuring 6.3 and 5.7 cm. Patient longer desires childbearing. She desires definitive management via hysterectomy.  Discussed preop workup including: Hemoglobin optimization, hemoglobin >10 for surgery.   Start p.o. iron.  Consider IV iron if Hgb is unimproved at next appointment.  Recommend GnRH antagonist given severe anemia, however patient does not currently have insurance coverage. Will start POP until then. Side effects reviewed for both. Pap smear EMB  Return to office for Pap and EMB at next appointment Patient starting new job October 2025 and will likely not have time to complete surgery until spring 2026.  Possible candidate for [facility] despite hx of prior ab myomectomy, reviewed 6wk recovery. Will assess further with pelvic exam at next appt.  Orders: -     Norethindrone ; Take 1 tablet (0.35 mg total) by mouth daily.  Dispense: 28 tablet; Refill: 2 -     Myfembree ; Take 1 tablet by mouth daily.  Dispense: 28 tablet; Refill: 11  Fibroid -     Norethindrone ; Take 1 tablet (0.35 mg total) by mouth daily.  Dispense: 28 tablet; Refill: 2 -     Myfembree ; Take 1 tablet by mouth daily.  Dispense: 28 tablet; Refill: 11  Iron deficiency anemia, unspecified iron deficiency anemia type  Pelvic pain Assessment & Plan: +Bulk symptoms (early satiety) and dysmenorrhea Likely 2/2 fibroids Rx for ibuprofen  and tylenol  sent  Orders: -     Ibuprofen ; Take 1 tablet (800 mg total) by mouth every 8 (eight) hours as needed.  Dispense: 60 tablet; Refill: 2 -     Acetaminophen ; Take 2 tablets (1,000 mg total) by mouth every 8 (eight) hours as needed.  Dispense: 90 tablet; Refill: 2     Vera LULLA Pa, MD

## 2023-12-30 ENCOUNTER — Encounter: Payer: Self-pay | Admitting: Obstetrics and Gynecology

## 2023-12-30 ENCOUNTER — Ambulatory Visit (INDEPENDENT_AMBULATORY_CARE_PROVIDER_SITE_OTHER): Payer: Self-pay | Admitting: Obstetrics and Gynecology

## 2023-12-30 VITALS — BP 100/62 | HR 84 | Temp 98.0°F | Ht 64.5 in | Wt 132.0 lb

## 2023-12-30 DIAGNOSIS — D509 Iron deficiency anemia, unspecified: Secondary | ICD-10-CM | POA: Insufficient documentation

## 2023-12-30 DIAGNOSIS — R102 Pelvic and perineal pain: Secondary | ICD-10-CM | POA: Insufficient documentation

## 2023-12-30 DIAGNOSIS — N920 Excessive and frequent menstruation with regular cycle: Secondary | ICD-10-CM

## 2023-12-30 DIAGNOSIS — D219 Benign neoplasm of connective and other soft tissue, unspecified: Secondary | ICD-10-CM | POA: Insufficient documentation

## 2023-12-30 MED ORDER — IBUPROFEN 800 MG PO TABS: 800.0000 mg | ORAL_TABLET | Freq: Three times a day (TID) | ORAL | 2 refills | Status: AC | PRN

## 2023-12-30 MED ORDER — NORETHINDRONE 0.35 MG PO TABS: 1.0000 | ORAL_TABLET | Freq: Every day | ORAL | 2 refills | Status: DC

## 2023-12-30 MED ORDER — MYFEMBREE 40-1-0.5 MG PO TABS: 1.0000 | ORAL_TABLET | Freq: Every day | ORAL | 11 refills | Status: DC

## 2023-12-30 MED ORDER — ACETAMINOPHEN 500 MG PO TABS: 1000.0000 mg | ORAL_TABLET | Freq: Three times a day (TID) | ORAL | 2 refills | Status: AC | PRN

## 2023-12-30 NOTE — Assessment & Plan Note (Addendum)
+  Bulk symptoms (early satiety) and dysmenorrhea Likely 2/2 fibroids Rx for ibuprofen  and tylenol  sent

## 2023-12-30 NOTE — Assessment & Plan Note (Addendum)
 Likely AUB-F, known fibroids with history of prior myomectomy Fibroids now measuring 6.3 and 5.7 cm. Patient longer desires childbearing. She desires definitive management via hysterectomy.  Discussed preop workup including: Hemoglobin optimization, hemoglobin >10 for surgery.  Start p.o. iron.  Consider IV iron if Hgb is unimproved at next appointment.  Recommend GnRH antagonist given severe anemia, however patient does not currently have insurance coverage. Will start POP until then. Side effects reviewed for both. Pap smear EMB  Return to office for Pap and EMB at next appointment Patient starting new job October 2025 and will likely not have time to complete surgery until spring 2026.  Possible candidate for [facility] despite hx of prior ab myomectomy, reviewed 6wk recovery. Will assess further with pelvic exam at next appt.

## 2024-01-07 ENCOUNTER — Ambulatory Visit: Payer: Self-pay | Admitting: Obstetrics and Gynecology

## 2024-01-13 ENCOUNTER — Encounter (HOSPITAL_COMMUNITY): Payer: Self-pay | Admitting: *Deleted

## 2024-01-13 ENCOUNTER — Other Ambulatory Visit: Payer: Self-pay

## 2024-01-13 ENCOUNTER — Emergency Department (HOSPITAL_COMMUNITY)

## 2024-01-13 ENCOUNTER — Emergency Department (HOSPITAL_COMMUNITY)
Admission: EM | Admit: 2024-01-13 | Discharge: 2024-01-14 | Discharge status: Against Medical Advice | Attending: Emergency Medicine | Admitting: Emergency Medicine

## 2024-01-13 DIAGNOSIS — R519 Headache, unspecified: Secondary | ICD-10-CM | POA: Insufficient documentation

## 2024-01-13 DIAGNOSIS — Z5321 Procedure and treatment not carried out due to patient leaving prior to being seen by health care provider: Secondary | ICD-10-CM | POA: Diagnosis not present

## 2024-01-13 MED ORDER — OXYCODONE-ACETAMINOPHEN 5-325 MG PO TABS
ORAL_TABLET | ORAL | Status: AC
  Filled 2024-01-13: qty 1

## 2024-01-13 MED ORDER — OXYCODONE-ACETAMINOPHEN 5-325 MG PO TABS
1.0000 | ORAL_TABLET | Freq: Once | ORAL | Status: AC
  Administered 2024-01-13: 1 via ORAL

## 2024-01-13 NOTE — ED Triage Notes (Signed)
 Pt reports facial pain, increased when bending down and with movement.

## 2024-01-13 NOTE — ED Notes (Signed)
 The pt reports that she struck her head on a door several days ago and she has had a headache since then

## 2024-01-14 NOTE — ED Notes (Signed)
 Patient decided to leave due to long wait.

## 2024-01-27 ENCOUNTER — Encounter: Payer: Self-pay | Admitting: Obstetrics and Gynecology

## 2024-01-27 ENCOUNTER — Other Ambulatory Visit (HOSPITAL_COMMUNITY)
Admission: RE | Admit: 2024-01-27 | Discharge: 2024-01-27 | Disposition: A | Discharge status: Home / Self Care | Source: Ambulatory Visit | Attending: Obstetrics and Gynecology | Admitting: Obstetrics and Gynecology

## 2024-01-27 ENCOUNTER — Ambulatory Visit (INDEPENDENT_AMBULATORY_CARE_PROVIDER_SITE_OTHER): Admitting: Obstetrics and Gynecology

## 2024-01-27 VITALS — BP 104/72 | HR 71 | Temp 98.1°F | Wt 132.0 lb

## 2024-01-27 DIAGNOSIS — N920 Excessive and frequent menstruation with regular cycle: Secondary | ICD-10-CM

## 2024-01-27 DIAGNOSIS — R102 Pelvic and perineal pain unspecified side: Secondary | ICD-10-CM | POA: Diagnosis not present

## 2024-01-27 DIAGNOSIS — D219 Benign neoplasm of connective and other soft tissue, unspecified: Secondary | ICD-10-CM

## 2024-01-27 DIAGNOSIS — Z01812 Encounter for preprocedural laboratory examination: Secondary | ICD-10-CM

## 2024-01-27 DIAGNOSIS — Z124 Encounter for screening for malignant neoplasm of cervix: Secondary | ICD-10-CM | POA: Diagnosis not present

## 2024-01-27 DIAGNOSIS — R790 Abnormal level of blood mineral: Secondary | ICD-10-CM

## 2024-01-27 DIAGNOSIS — D509 Iron deficiency anemia, unspecified: Secondary | ICD-10-CM

## 2024-01-27 LAB — PREGNANCY, URINE: Preg Test, Ur: NEGATIVE

## 2024-01-27 MED ORDER — MYFEMBREE 40-1-0.5 MG PO TABS: 1.0000 | ORAL_TABLET | Freq: Every day | ORAL | 11 refills | Status: AC

## 2024-01-27 MED ORDER — CYCLOBENZAPRINE HCL 10 MG PO TABS: 10.0000 mg | ORAL_TABLET | Freq: Three times a day (TID) | ORAL | 0 refills | Status: DC | PRN

## 2024-01-27 NOTE — Assessment & Plan Note (Signed)
 Likely AUB-F, known fibroids with history of prior myomectomy Fibroids now measuring 6.3 and 5.7 cm. Bleeding poorly controlled with POP. Sometime smoker so COC contraindicated. Will switch to myfembree  given severe anemia and poor response to POP.  Previously discussed side effects.  Patient longer desires childbearing. She desires definitive management via abdominal hysterectomy.  She prefers use of prior incision to laparoscopic procedure.  This is reasonable given bulky posterior fibroids. Plan for TAH, bilateral salpingectomy, cystoscopy. Discussed observation status. Reviewed that  recovery is usually 6 weeks with additional 4 weeks of pelvic rest. Risks including infections, bleeding, and damage to surrounding organs reviewed.  Patient is agreeable to blood transfusion in the event of an emergency.  Patient in agreement with initial plan. Orders placed for surgery. RTO for preop visit.   Discussed preop workup including: Hemoglobin optimization, hemoglobin >10 for surgery.  Start p.o. iron.  Consider IV iron if Hgb is unimproved.   Pap smear- collected EMB- uncomplicated

## 2024-01-27 NOTE — Progress Notes (Signed)
 48 y.o. G0P0000 female with AUB-F (Prior abdominal myomectomy May 2012, note in EPIC), tobacco use here for follow-up. Single.  Patient's last menstrual period was 01/23/2024 (exact date). Period Pattern: Regular Menstrual Flow: Heavy Menstrual Control: Maxi pad Dysmenorrhea: (!) Severe Dysmenorrhea Symptoms: Nausea, Diarrhea, Headache  She is in between jobs, pain has kept her from being able to work. Most recent Hgb 7.9. She has been taking POP, ibuprofen , PO Fe. Rare recent GYN care, only ED visits as needed. Declines future child bearing +Bulk symptoms (early satiety) and dysmenorrhea Periods are still heavy. She is having significant abdominal pain, low appetite and energy despite medications. Constipation, will start stool softner  Last PAP: 12/13/2010  09/09/23 TVUS: Uterus   Measurements: 13.1 x 7.8 x 8.8 cm = volume: 469 mL. Anterior intramural fibroid is present measuring 6.3 x 5.3 x 4.5 cm. Posterior intramural fibroid is present measuring 5.7 x 5.0 x 5.9 cm.   Endometrium   Thickness: 6.9 mm.  No focal abnormality visualized.   Right ovary   Measurements: 2.7 x 1.2 x 2.5 cm = volume: 4.3 mL. Normal appearance/no adnexal mass.   Left ovary   Measurements: 3.2 x 1.5 x 1.7 cm = volume: 4.3 mL. Normal appearance/no adnexal mass.   Pulsed Doppler evaluation of both ovaries demonstrates normal low-resistance arterial and venous waveforms.   Other findings   No abnormal free fluid.   IMPRESSION: 1. Enlarged uterus with 2 fibroids measuring up to 6.3 cm. 2. No evidence of ovarian torsion.  Birth control: none  GYN HISTORY: Prior abdominal myomectomy  OB History  Gravida Para Term Preterm AB Living  0 0 0 0 0 0  SAB IAB Ectopic Multiple Live Births  0 0 0 0     Past Medical History:  Diagnosis Date   Anemia    Fibroids     Past Surgical History:  Procedure Laterality Date   MYOMECTOMY  may 2012    Current Outpatient Medications on File  Prior to Visit  Medication Sig Dispense Refill   acetaminophen  (TYLENOL ) 500 MG tablet Take 2 tablets (1,000 mg total) by mouth every 8 (eight) hours as needed. 90 tablet 2   Aspirin-Salicylamide-Caffeine (ARTHRITIS STRENGTH BC POWDER PO) Take by mouth.     Calcium Carbonate Antacid (TUMS PO) Take by mouth.     ibuprofen  (ADVIL ) 800 MG tablet Take 1 tablet (800 mg total) by mouth every 8 (eight) hours as needed. 60 tablet 2   norethindrone  (MICRONOR ) 0.35 MG tablet Take 1 tablet (0.35 mg total) by mouth daily. 28 tablet 2   Ferric Maltol  (ACCRUFER ) 30 MG CAPS Take 1 capsule (30 mg total) by mouth 2 (two) times daily. (Patient not taking: Reported on 01/27/2024) 60 capsule 2   [DISCONTINUED] loratadine (CLARITIN) 10 MG tablet Take 10 mg by mouth daily as needed for allergies.     [DISCONTINUED] norgestimate -ethinyl estradiol  (SPRINTEC 28) 0.25-35 MG-MCG tablet Take 1 tablet by mouth daily. 1 Package 11   No current facility-administered medications on file prior to visit.    Allergies  Allergen Reactions   Latex Itching      PE Today's Vitals   01/27/24 1406  BP: 104/72  Pulse: 71  Temp: 98.1 F (36.7 C)  TempSrc: Oral  SpO2: 99%  Weight: 132 lb (59.9 kg)   Body mass index is 22.66 kg/m.  Physical Exam Vitals reviewed. Exam conducted with a chaperone present.  Constitutional:      General: She is not in acute  distress.    Appearance: Normal appearance.  HENT:     Head: Normocephalic and atraumatic.     Nose: Nose normal.  Eyes:     Extraocular Movements: Extraocular movements intact.     Conjunctiva/sclera: Conjunctivae normal.  Pulmonary:     Effort: Pulmonary effort is normal.  Abdominal:     Comments: Well healed Pfannentiel  Genitourinary:    General: Normal vulva.     Exam position: Lithotomy position.     Vagina: Normal. No vaginal discharge.     Cervix: Normal. No cervical motion tenderness, discharge or lesion.     Uterus: Normal. Enlarged, fixed and  tender.      Adnexa: Right adnexa normal and left adnexa normal.     Comments: Bulky fibroids filling posterior CDS Musculoskeletal:        General: Normal range of motion.     Cervical back: Normal range of motion.  Neurological:     General: No focal deficit present.     Mental Status: She is alert.  Psychiatric:        Mood and Affect: Mood normal.        Behavior: Behavior normal.     Procedure Consent was signed. Timeout was performed. Speculum inserted into the vagina, cervix visualized and was prepped with Hibiclens .  Cervical block was declined A single-toothed tenaculum was placed on the anterior lip of the cervix to stabilize it.  The 3 mm pipelle was introduced into the endometrial cavity without difficulty to a depth of 9cm, suction initiated and a moderate amount of tissue was obtained and sent to pathology.  The instruments were removed from the patient's vagina.  Minimal bleeding from the cervix was noted.  The patient tolerated the procedure well.     Assessment and Plan:        Pre-procedure lab exam -     Pregnancy, urine  Menorrhagia with regular cycle Assessment & Plan: Likely AUB-F, known fibroids with history of prior myomectomy Fibroids now measuring 6.3 and 5.7 cm. Bleeding poorly controlled with POP. Sometime smoker so COC contraindicated. Will switch to myfembree  given severe anemia and poor response to POP.  Previously discussed side effects.  Patient longer desires childbearing. She desires definitive management via abdominal hysterectomy.  She prefers use of prior incision to laparoscopic procedure.  This is reasonable given bulky posterior fibroids. Plan for TAH, bilateral salpingectomy, cystoscopy. Discussed observation status. Reviewed that  recovery is usually 6 weeks with additional 4 weeks of pelvic rest. Risks including infections, bleeding, and damage to surrounding organs reviewed.  Patient is agreeable to blood transfusion in the event  of an emergency.  Patient in agreement with initial plan. Orders placed for surgery. RTO for preop visit.   Discussed preop workup including: Hemoglobin optimization, hemoglobin >10 for surgery.  Start p.o. iron.  Consider IV iron if Hgb is unimproved.   Pap smear- collected EMB- uncomplicated  Orders: -     Myfembree ; Take 1 tablet by mouth daily.  Dispense: 28 tablet; Refill: 11 -     Surgical pathology -     Ambulatory Referral For Surgery Scheduling  Fibroid -     Myfembree ; Take 1 tablet by mouth daily.  Dispense: 28 tablet; Refill: 11 -     Ambulatory Referral For Surgery Scheduling  Cervical cancer screening -     Cytology - PAP  Pelvic pain -     Cyclobenzaprine HCl; Take 1 tablet (10 mg total) by mouth 3 (three) times  daily as needed for muscle spasms.  Dispense: 30 tablet; Refill: 0  Iron deficiency anemia, unspecified iron deficiency anemia type -     Ferritin -     CBC  Low ferritin -     Ferritin -     CBC  Taking 1.5 tab 800mg  ibuprofen  at a time. Discouraged and instructed to take as prescribed.  Vera LULLA Pa, MD

## 2024-01-28 LAB — CBC
HCT: 30.3 % — ABNORMAL LOW (ref 35.0–45.0)
Hemoglobin: 8.3 g/dL — ABNORMAL LOW (ref 11.7–15.5)
MCH: 18.2 pg — ABNORMAL LOW (ref 27.0–33.0)
MCHC: 27.4 g/dL — ABNORMAL LOW (ref 32.0–36.0)
MCV: 66.6 fL — ABNORMAL LOW (ref 80.0–100.0)
MPV: 10.5 fL (ref 7.5–12.5)
Platelets: 306 Thousand/uL (ref 140–400)
RBC: 4.55 Million/uL (ref 3.80–5.10)
RDW: 18.4 % — ABNORMAL HIGH (ref 11.0–15.0)
WBC: 7.3 Thousand/uL (ref 3.8–10.8)

## 2024-01-28 LAB — FERRITIN: Ferritin: 4 ng/mL — ABNORMAL LOW (ref 16–232)

## 2024-01-29 LAB — CYTOLOGY - PAP
Comment: NEGATIVE
Diagnosis: NEGATIVE
High risk HPV: NEGATIVE

## 2024-01-29 LAB — SURGICAL PATHOLOGY

## 2024-02-02 ENCOUNTER — Ambulatory Visit: Payer: Self-pay | Admitting: Obstetrics and Gynecology

## 2024-02-02 DIAGNOSIS — D509 Iron deficiency anemia, unspecified: Secondary | ICD-10-CM

## 2024-02-02 DIAGNOSIS — N920 Excessive and frequent menstruation with regular cycle: Secondary | ICD-10-CM

## 2024-02-05 NOTE — Telephone Encounter (Signed)
 Spoke with pt provided her the results. She wanted to know if iron infusions would be something that she should consider vs the over the counter iron?   Please advise.

## 2024-02-09 ENCOUNTER — Encounter: Payer: Self-pay | Admitting: Obstetrics and Gynecology

## 2024-02-09 ENCOUNTER — Encounter (HOSPITAL_COMMUNITY): Payer: Self-pay | Admitting: Obstetrics and Gynecology

## 2024-02-09 NOTE — Telephone Encounter (Signed)
 Spoke with patient regarding sterilization consent for surgery, see surgery referral.   Patient also f/u on status of recommendations for hematology referral for iron infusions.   Patient states she currently takes OTC PO iron daily, did not start Accrufer  due to [facility] cost.   Advised message previously sent to Dr. Dallie, our office will f/u once reviewed. Patient agreeable.

## 2024-02-09 NOTE — Telephone Encounter (Signed)
 Given use of oral iron with no improvement in Hgb and ongoing heavy menses, recommend IV iron.  No hematology referral needed. Therapy plan orders placed for IV venofer q2wk x4 infusions at infusion center. Will need CBC and ferritin, 1 month after completion of iron infusion.

## 2024-02-09 NOTE — Telephone Encounter (Signed)
 Spoke with patient. Advised per Dr. Dallie. PA and scheduling process. Patient will notify office once scheduled to schedule f/u labs in 1 month after completion of iron infusions. Patient verbalizes understanding and is agreeable.

## 2024-02-11 ENCOUNTER — Other Ambulatory Visit (HOSPITAL_COMMUNITY): Payer: Self-pay | Admitting: Obstetrics and Gynecology

## 2024-02-11 ENCOUNTER — Telehealth (HOSPITAL_COMMUNITY): Payer: Self-pay | Admitting: Pharmacy Technician

## 2024-02-11 NOTE — Telephone Encounter (Addendum)
 Auth Submission: NO AUTH NEEDED Site of care: [facility] Payer: [employer] Medication & CPT/J Code(s) submitted: Venofer (Iron Sucrose) J1756 Diagnosis Code: D50.0 Route of submission (phone, fax, portal):  Phone # Fax # Auth type: Buy/Bill HB Units/visits requested: 200mg  x 4 doses Reference number:  Approval from: 02/11/2024 to 04/06/24    Dagoberto Armour, CPhT [facility] Phone: [phone] 02/11/2024

## 2024-02-15 NOTE — Telephone Encounter (Signed)
 Per review of EPIC, patient is scheduled for first iron infusion on 02/18/24

## 2024-02-18 ENCOUNTER — Ambulatory Visit (HOSPITAL_COMMUNITY)
Admission: RE | Admit: 2024-02-18 | Discharge: 2024-02-18 | Disposition: A | Discharge status: Home / Self Care | Source: Ambulatory Visit | Attending: Obstetrics and Gynecology | Admitting: Obstetrics and Gynecology

## 2024-02-18 VITALS — BP 124/86 | HR 85 | Temp 98.2°F | Resp 16

## 2024-02-18 DIAGNOSIS — D500 Iron deficiency anemia secondary to blood loss (chronic): Secondary | ICD-10-CM | POA: Diagnosis present

## 2024-02-18 MED ORDER — SODIUM CHLORIDE 0.9 % IV SOLN: INTRAVENOUS | Status: DC | PRN

## 2024-02-18 MED ORDER — IRON SUCROSE 200 MG IVPB - SIMPLE MED
200.0000 mg | Freq: Once | Status: AC
  Administered 2024-02-18: 200 mg via INTRAVENOUS
  Filled 2024-02-18: qty 200

## 2024-02-18 NOTE — Progress Notes (Signed)
 PATIENT CARE CENTER NOTE   Diagnosis: Iron deficiency anemia due to chronic blood loss [D50.0]    Provider: Vera Pa, MD   Procedure: Venofer infusion    Note: Patient received Venofer 200 mg infusion (dose # 1 of 4) via PIV. Patient tolerated infusion well with no adverse reaction. Observed patient for 30 minutes post infusion. Vital signs stable. Discharge instructions given. Patient to come back next Monday for second infusion and will schedule next appointment at the front desk. Patient alert, oriented and ambulatory at discharge.

## 2024-02-22 ENCOUNTER — Ambulatory Visit (HOSPITAL_COMMUNITY)
Admission: RE | Admit: 2024-02-22 | Discharge: 2024-02-22 | Disposition: A | Source: Ambulatory Visit | Attending: Internal Medicine

## 2024-02-22 VITALS — BP 127/84 | HR 64 | Temp 98.4°F | Resp 16

## 2024-02-22 DIAGNOSIS — D500 Iron deficiency anemia secondary to blood loss (chronic): Secondary | ICD-10-CM | POA: Diagnosis not present

## 2024-02-22 MED ORDER — SODIUM CHLORIDE 0.9 % IV SOLN: INTRAVENOUS | Status: DC | PRN

## 2024-02-22 MED ORDER — SODIUM CHLORIDE 0.9 % IV SOLN
200.0000 mg | Freq: Once | INTRAVENOUS | Status: AC
  Administered 2024-02-22: 200 mg via INTRAVENOUS
  Filled 2024-02-22: qty 10

## 2024-02-22 NOTE — Progress Notes (Signed)
 PATIENT CARE CENTER NOTE     Diagnosis: Iron deficiency anemia due to chronic blood loss [D50.0]      Provider: Vera Pa, MD     Procedure: Venofer infusion      Note: Patient received Venofer 200 mg infusion (dose # 2 of 4) via PIV. Patient tolerated infusion well with no adverse reaction. Patient declined to wait for the 30 minute post infusion observation since she tolerated the last infusion well. Vital signs stable. Printed AVS offered but patient declined. Patient scheduled to come back on Wednesday 11/19 for next infusion. Patient alert, oriented and ambulatory at discharge.

## 2024-02-24 ENCOUNTER — Ambulatory Visit (HOSPITAL_COMMUNITY)

## 2024-02-26 ENCOUNTER — Ambulatory Visit (HOSPITAL_COMMUNITY)
Admission: RE | Admit: 2024-02-26 | Discharge: 2024-02-26 | Disposition: A | Discharge status: Home / Self Care | Source: Ambulatory Visit | Attending: Obstetrics and Gynecology | Admitting: Obstetrics and Gynecology

## 2024-02-26 VITALS — BP 132/88 | HR 75 | Temp 98.1°F | Resp 16

## 2024-02-26 DIAGNOSIS — D500 Iron deficiency anemia secondary to blood loss (chronic): Secondary | ICD-10-CM

## 2024-02-26 MED ORDER — SODIUM CHLORIDE 0.9 % IV SOLN: INTRAVENOUS | Status: DC | PRN

## 2024-02-26 MED ORDER — SODIUM CHLORIDE 0.9 % IV SOLN
200.0000 mg | Freq: Once | INTRAVENOUS | Status: AC
  Administered 2024-02-26: 200 mg via INTRAVENOUS
  Filled 2024-02-26: qty 10

## 2024-02-26 NOTE — Progress Notes (Signed)
 PATIENT CARE CENTER NOTE     Diagnosis: Iron  deficiency anemia due to chronic blood loss [D50.0]      Provider: Vera Pa, MD     Procedure: Venofer  infusion      Note: Patient received Venofer  200 mg infusion (dose # 3 of 4) via PIV. Patient tolerated infusion well with no adverse reaction. Patient declined to wait for the 30 minute post infusion observation since she tolerated the last infusion well. Vital signs stable. Printed AVS offered but patient declined. Patient scheduled to come back on Monday, 12/01 for next infusion. Patient alert, oriented and ambulatory at discharge.

## 2024-02-28 ENCOUNTER — Other Ambulatory Visit: Payer: Self-pay | Admitting: Obstetrics and Gynecology

## 2024-02-28 DIAGNOSIS — D219 Benign neoplasm of connective and other soft tissue, unspecified: Secondary | ICD-10-CM

## 2024-02-28 DIAGNOSIS — N920 Excessive and frequent menstruation with regular cycle: Secondary | ICD-10-CM

## 2024-02-29 NOTE — Telephone Encounter (Signed)
 Med refill request: Meleya  / norethindrone  (MICRONOR ) 0.35 MG tablet  Start:  12/30/23 Disp: 28  tablets Refills:  2  Last OV:  01/27/24 Next AEX:  Not yet scheduled Last MMG (if hormonal med):  Not found Refill authorized? Please Advise.

## 2024-02-29 NOTE — Telephone Encounter (Signed)
 Refill Request Refused As per Dr. Dallie, Pt is now taking Myfembree .

## 2024-02-29 NOTE — Telephone Encounter (Signed)
 Pt called back to say she did not remember the conversation regarding discontinuing the Micronor . Pt did start taking the Myfembree , but Pt states she was taking both prescriptions together. Pt added that she finally ran out of the Micronor , which is why she called in the refill.  Pt says she will discontinue the old Rx, and continue taking the Myfembree .

## 2024-02-29 NOTE — Telephone Encounter (Signed)
 Called Pt at [phone] to confirm whether or not she started taking the Myfembree . No answer. LVM for Pt to call us  back.

## 2024-03-07 ENCOUNTER — Ambulatory Visit (HOSPITAL_COMMUNITY)
Admission: RE | Admit: 2024-03-07 | Discharge: 2024-03-07 | Disposition: A | Discharge status: Home / Self Care | Source: Ambulatory Visit | Attending: Internal Medicine | Admitting: Internal Medicine

## 2024-03-07 VITALS — BP 147/103 | HR 79 | Temp 98.4°F | Resp 16

## 2024-03-07 DIAGNOSIS — D500 Iron deficiency anemia secondary to blood loss (chronic): Secondary | ICD-10-CM | POA: Insufficient documentation

## 2024-03-07 MED ORDER — SODIUM CHLORIDE 0.9 % IV SOLN: INTRAVENOUS | Status: DC | PRN

## 2024-03-07 MED ORDER — IRON SUCROSE 200 MG IVPB - SIMPLE MED
200.0000 mg | Freq: Once | Status: AC
  Administered 2024-03-07: 200 mg via INTRAVENOUS
  Filled 2024-03-07: qty 110

## 2024-03-07 NOTE — Progress Notes (Signed)
 PATIENT CARE CENTER NOTE     Diagnosis: Iron  deficiency anemia due to chronic blood loss [D50.0]      Provider: Vera Pa, MD     Procedure: Venofer  infusion      Note: Patient received Venofer  200 mg infusion (dose # 4 of 4) via PIV. Patient tolerated infusion well with no adverse reaction. BP elevated pre and post infusion. Per patient, she is having painful menstrual cramps and she believes that this is causing her elevated BP.  Patient declined to wait for the 30 minute post infusion observation since she tolerated her previous infusions well. Printed AVS offered but patient declined. Patient alert, oriented and ambulatory at discharge.

## 2024-03-16 NOTE — Addendum Note (Signed)
 Addended by: BRUTUS KATE SAILOR on: 03/16/2024 03:03 PM   Modules accepted: Orders

## 2024-03-28 ENCOUNTER — Other Ambulatory Visit: Payer: Self-pay | Admitting: Obstetrics and Gynecology

## 2024-03-28 DIAGNOSIS — R102 Pelvic and perineal pain unspecified side: Secondary | ICD-10-CM

## 2024-03-28 NOTE — Telephone Encounter (Signed)
 Med refill request:FLEXERIL   Last AEX: last OV 01/27/24 Next AEX: not scheduled  Last MMG (if hormonal med) Refill authorized: Last rx 01/27/24 #30 with 0 refills. Please Advise?

## 2024-04-11 ENCOUNTER — Other Ambulatory Visit

## 2024-04-11 DIAGNOSIS — N920 Excessive and frequent menstruation with regular cycle: Secondary | ICD-10-CM

## 2024-04-11 DIAGNOSIS — D509 Iron deficiency anemia, unspecified: Secondary | ICD-10-CM

## 2024-04-12 ENCOUNTER — Ambulatory Visit: Payer: Self-pay | Admitting: Obstetrics and Gynecology

## 2024-04-12 LAB — CBC
HCT: 42.2 % (ref 35.9–46.0)
Hemoglobin: 12.9 g/dL (ref 11.7–15.5)
MCH: 25.1 pg — ABNORMAL LOW (ref 27.0–33.0)
MCHC: 30.6 g/dL — ABNORMAL LOW (ref 31.6–35.4)
MCV: 82.1 fL (ref 81.4–101.7)
MPV: 11 fL (ref 7.5–12.5)
Platelets: 283 Thousand/uL (ref 140–400)
RBC: 5.14 Million/uL — ABNORMAL HIGH (ref 3.80–5.10)
WBC: 7.4 Thousand/uL (ref 3.8–10.8)

## 2024-04-12 LAB — FERRITIN: Ferritin: 21 ng/mL (ref 16–232)

## 2024-04-14 ENCOUNTER — Telehealth: Payer: Self-pay

## 2024-04-14 NOTE — Telephone Encounter (Signed)
 Patient called triage asking about her lab results regarding her iron . Patient notified of results. Patient is asking if she can stop her iron  infusions and if she can go ahead and schedule a surgery date. Routing to Dr Dallie and Kate.

## 2024-04-15 NOTE — Telephone Encounter (Signed)
 See surgery referral.   Encounter closed.

## 2024-05-16 ENCOUNTER — Encounter: Admitting: Obstetrics and Gynecology

## 2024-06-09 ENCOUNTER — Ambulatory Visit (HOSPITAL_COMMUNITY): Admit: 2024-06-09 | Admitting: Obstetrics and Gynecology

## 2024-06-09 SURGERY — HYSTERECTOMY, TOTAL, ABDOMINAL, WITH SALPINGECTOMY
Anesthesia: General
